# Patient Record
Sex: Female | Born: 1995 | Race: White | Hispanic: No | Marital: Married | Smoking: Never smoker
Health system: Southern US, Community
[De-identification: ages and names within clinical notes are randomized; demographics above are authoritative.]

## PROBLEM LIST (undated history)

## (undated) HISTORY — PX: EXTERNAL EAR SURGERY: SHX627

---
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
## 2014-06-16 ENCOUNTER — Encounter (HOSPITAL_COMMUNITY): Payer: Self-pay | Admitting: Emergency Medicine

## 2014-06-16 ENCOUNTER — Inpatient Hospital Stay (HOSPITAL_COMMUNITY)
Admission: EM | Admit: 2014-06-16 | Discharge: 2014-06-17 | DRG: 552 | Disposition: A | Discharge status: Home / Self Care | Payer: Self-pay | Attending: Surgery | Admitting: Surgery

## 2014-06-16 ENCOUNTER — Emergency Department (HOSPITAL_COMMUNITY): Payer: Self-pay

## 2014-06-16 DIAGNOSIS — S129XXA Fracture of neck, unspecified, initial encounter: Secondary | ICD-10-CM

## 2014-06-16 DIAGNOSIS — S01309A Unspecified open wound of unspecified ear, initial encounter: Secondary | ICD-10-CM | POA: Diagnosis present

## 2014-06-16 DIAGNOSIS — S01312A Laceration without foreign body of left ear, initial encounter: Secondary | ICD-10-CM

## 2014-06-16 DIAGNOSIS — S06320A Contusion and laceration of left cerebrum without loss of consciousness, initial encounter: Secondary | ICD-10-CM

## 2014-06-16 DIAGNOSIS — S12000A Unspecified displaced fracture of first cervical vertebra, initial encounter for closed fracture: Principal | ICD-10-CM | POA: Diagnosis present

## 2014-06-16 DIAGNOSIS — S01302A Unspecified open wound of left ear, initial encounter: Secondary | ICD-10-CM | POA: Diagnosis present

## 2014-06-16 DIAGNOSIS — S0633AA Contusion and laceration of cerebrum, unspecified, with loss of consciousness status unknown, initial encounter: Secondary | ICD-10-CM | POA: Diagnosis present

## 2014-06-16 DIAGNOSIS — S062X0A Diffuse traumatic brain injury without loss of consciousness, initial encounter: Secondary | ICD-10-CM | POA: Diagnosis present

## 2014-06-16 DIAGNOSIS — S06339A Contusion and laceration of cerebrum, unspecified, with loss of consciousness of unspecified duration, initial encounter: Secondary | ICD-10-CM | POA: Diagnosis present

## 2014-06-16 LAB — CBC
HCT: 39.6 % (ref 36.0–46.0)
Hemoglobin: 12.9 g/dL (ref 12.0–15.0)
MCH: 26.7 pg (ref 26.0–34.0)
MCHC: 32.6 g/dL (ref 30.0–36.0)
MCV: 82 fL (ref 78.0–100.0)
PLATELETS: 315 10*3/uL (ref 150–400)
RBC: 4.83 MIL/uL (ref 3.87–5.11)
RDW: 13.3 % (ref 11.5–15.5)
WBC: 18.5 10*3/uL — ABNORMAL HIGH (ref 4.0–10.5)

## 2014-06-16 LAB — COMPREHENSIVE METABOLIC PANEL
ALT: 13 U/L (ref 0–35)
ANION GAP: 13 (ref 5–15)
AST: 17 U/L (ref 0–37)
Albumin: 4.2 g/dL (ref 3.5–5.2)
Alkaline Phosphatase: 56 U/L (ref 39–117)
BILIRUBIN TOTAL: 0.3 mg/dL (ref 0.3–1.2)
BUN: 7 mg/dL (ref 6–23)
CHLORIDE: 104 meq/L (ref 96–112)
CO2: 23 meq/L (ref 19–32)
CREATININE: 0.69 mg/dL (ref 0.50–1.10)
Calcium: 9.1 mg/dL (ref 8.4–10.5)
GFR calc Af Amer: >90 mL/min (ref >90)
GLUCOSE: 108 mg/dL — AB (ref 70–99)
Potassium: 3.9 mEq/L (ref 3.7–5.3)
Sodium: 140 mEq/L (ref 137–147)
Total Protein: 6.9 g/dL (ref 6.0–8.3)

## 2014-06-16 LAB — SAMPLE TO BLOOD BANK

## 2014-06-16 LAB — ETHANOL: Alcohol, Ethyl (B): <11 mg/dL (ref 0–11)

## 2014-06-16 LAB — PROTIME-INR
INR: 1.07 (ref 0.00–1.49)
PROTHROMBIN TIME: 13.9 s (ref 11.6–15.2)

## 2014-06-16 LAB — MRSA PCR SCREENING: MRSA by PCR: NEGATIVE

## 2014-06-16 MED ORDER — FENTANYL CITRATE 0.05 MG/ML IJ SOLN
INTRAMUSCULAR | Status: AC
  Filled 2014-06-16: qty 2

## 2014-06-16 MED ORDER — CIPROFLOXACIN-DEXAMETHASONE 0.3-0.1 % OT SUSP
4.0000 [drp] | Freq: Once | OTIC | Status: AC
  Administered 2014-06-16: 4 [drp] via OTIC
  Filled 2014-06-16: qty 7.5

## 2014-06-16 MED ORDER — ONDANSETRON HCL 4 MG/2ML IJ SOLN
4.0000 mg | Freq: Once | INTRAMUSCULAR | Status: AC
Start: 2014-06-16 — End: 2014-06-16
  Administered 2014-06-16: 4 mg via INTRAVENOUS
  Filled 2014-06-16: qty 2

## 2014-06-16 MED ORDER — OXYCODONE HCL 5 MG PO TABS
5.0000 mg | ORAL_TABLET | ORAL | Status: DC | PRN
  Administered 2014-06-17: 5 mg via ORAL
  Filled 2014-06-16: qty 1

## 2014-06-16 MED ORDER — CIPROFLOXACIN-DEXAMETHASONE 0.3-0.1 % OT SUSP
4.0000 [drp] | Freq: Two times a day (BID) | OTIC | Status: DC
  Filled 2014-06-16: qty 7.5

## 2014-06-16 MED ORDER — MORPHINE SULFATE 2 MG/ML IJ SOLN: 2.0000 mg | INTRAMUSCULAR | Status: DC | PRN

## 2014-06-16 MED ORDER — ONDANSETRON HCL 4 MG PO TABS: 4.0000 mg | ORAL_TABLET | Freq: Four times a day (QID) | ORAL | Status: DC | PRN

## 2014-06-16 MED ORDER — FENTANYL CITRATE 0.05 MG/ML IJ SOLN
50.0000 ug | Freq: Once | INTRAMUSCULAR | Status: AC
  Administered 2014-06-16: 50 ug via INTRAVENOUS
  Filled 2014-06-16: qty 2

## 2014-06-16 MED ORDER — TETANUS-DIPHTH-ACELL PERTUSSIS 5-2.5-18.5 LF-MCG/0.5 IM SUSP
0.5000 mL | Freq: Once | INTRAMUSCULAR | Status: AC
  Administered 2014-06-16: 0.5 mL via INTRAMUSCULAR
  Filled 2014-06-16: qty 0.5

## 2014-06-16 MED ORDER — SODIUM CHLORIDE 0.9 % IV SOLN
INTRAVENOUS | Status: DC
  Administered 2014-06-16 – 2014-06-17 (×2): via INTRAVENOUS

## 2014-06-16 MED ORDER — ONDANSETRON HCL 4 MG/2ML IJ SOLN: 4.0000 mg | Freq: Four times a day (QID) | INTRAMUSCULAR | Status: DC | PRN

## 2014-06-16 NOTE — ED Notes (Signed)
ENT MD remains at bedside suturing.
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
## 2014-06-16 NOTE — ED Notes (Signed)
ENT and Trauma MDs at bedside.
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
## 2014-06-16 NOTE — ED Notes (Signed)
Pt vomiting, orders received for zofran

## 2014-06-17 ENCOUNTER — Inpatient Hospital Stay (HOSPITAL_COMMUNITY): Payer: Medicaid Other

## 2014-06-17 DIAGNOSIS — S12000A Unspecified displaced fracture of first cervical vertebra, initial encounter for closed fracture: Secondary | ICD-10-CM | POA: Diagnosis present

## 2014-06-17 DIAGNOSIS — S01302A Unspecified open wound of left ear, initial encounter: Secondary | ICD-10-CM | POA: Diagnosis present

## 2014-06-17 MED ORDER — OXYCODONE-ACETAMINOPHEN 5-325 MG PO TABS: 1.0000 | ORAL_TABLET | ORAL | Status: DC | PRN

## 2014-06-17 MED ORDER — CIPROFLOXACIN-DEXAMETHASONE 0.3-0.1 % OT SUSP: 4.0000 [drp] | Freq: Two times a day (BID) | OTIC | Status: DC

## 2014-06-17 NOTE — Progress Notes (Addendum)
ED CM received phone call from patient concerning not being able to afford discharge prescription. Reviewed record. CM enrolled patient in [facility] program., Patient reports not having MATCH letter printed MATCH letter, patient requested to have it faxed to [facility] on [address]. MATCH faxed confirmed receipt. Notified [facility] pharmacy. No further CM needs identified.

## 2014-06-17 NOTE — Progress Notes (Addendum)
UR completed.  Pt enrolled in MATCH program for medication assistance.  Provided with letter, list of participating pharmacies and an explanation of the process. Pt/caregiver stated understanding.  Airyn Ellzey, RN BSN [facility] CCM Trauma/Neuro ICU Case Manager [phone]  

## 2014-06-17 NOTE — Discharge Summary (Signed)
Agree with summary. 

## 2014-06-17 NOTE — Progress Notes (Signed)
Patient seen and examined.  Will advance diet and mobilize and see how she feels.
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
## 2014-06-17 NOTE — Discharge Instructions (Signed)
Auricle Injuries You have an injury to your external ear (auricle). The ear has a layer of skin over cartilage. A cut or bruise to the ear can separate the skin from the cartilage underneath. This can cause problems with healing if blood gathers between the skin and the cartilage. Permanent damage to the ear may result if the excess blood is not drained within 1 to 2 days. Stitches, tape, or tissue glue may be used to close a cut. A pressure bandage may be used to keep blood from forming under the injured skin. If there is a lot of blood present (hematoma), a needle aspiration may be needed to remove it. You must have the ear checked within 1 to 2 days or as directed if you have had this type of injury. This is see if the blood has accumulated again. Call your caregiver for a follow-up exam as recommended.  SEEK IMMEDIATE MEDICAL CARE IF:  You develop severe pain.  You develop a fever or pus like drainage.  You have increased hearing loss or other problems. MAKE SURE YOU:   Understand these instructions.  Will watch your condition.  Will get help right away if you are not doing well or get worse. Document Released: 09/26/2005 Document Revised: 12/19/2011 Document Reviewed: 03/15/2007 [facility] Patient Information 2015 [place], [place]. This information is not intended to replace advice given to you by your health care provider. Make sure you discuss any questions you have with your health care provider.  OK to shower Ear drops to LEFT ear twice daily Keep head elevated 3-4 nights Clean wounds/sutures with Qtip and water twice daily, then apply a thin coat of Bacitracin ointment. Recheck 7-8 days for suture removal ([phone]) for an appointment Call for bleeding, signs of infection Large ear dressing can be removed on Wednesday.  Keep collar on at all times.  No driving while taking oxycodone and until cleared by Dr. Venetia Maxon
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
## 2014-10-02 ENCOUNTER — Emergency Department (HOSPITAL_COMMUNITY)
Admission: EM | Admit: 2014-10-02 | Discharge: 2014-10-02 | Discharge status: Against Medical Advice | Payer: Medicaid Other | Attending: Emergency Medicine | Admitting: Emergency Medicine

## 2014-10-02 ENCOUNTER — Encounter (HOSPITAL_COMMUNITY): Payer: Self-pay | Admitting: Emergency Medicine

## 2014-10-02 DIAGNOSIS — R110 Nausea: Secondary | ICD-10-CM | POA: Diagnosis not present

## 2014-10-02 DIAGNOSIS — R109 Unspecified abdominal pain: Secondary | ICD-10-CM | POA: Insufficient documentation

## 2014-10-02 LAB — URINALYSIS, ROUTINE W REFLEX MICROSCOPIC
BILIRUBIN URINE: NEGATIVE
Glucose, UA: NEGATIVE mg/dL
Hgb urine dipstick: NEGATIVE
Ketones, ur: 15 mg/dL — AB
Leukocytes, UA: NEGATIVE
NITRITE: NEGATIVE
PROTEIN: NEGATIVE mg/dL
UROBILINOGEN UA: 0.2 mg/dL (ref 0.0–1.0)
pH: 6 (ref 5.0–8.0)

## 2014-10-02 LAB — POC URINE PREG, ED: Preg Test, Ur: POSITIVE — AB

## 2014-10-02 NOTE — ED Notes (Signed)
Pt has decided to leave AMA, d/t long wait for physician to exam her.

## 2014-10-02 NOTE — ED Notes (Signed)
Pt is having nausea and bloating feeling.

## 2014-10-10 NOTE — L&D Delivery Note (Signed)
Delivery Note At 1:28 AM a viable female was delivered via Vaginal, Spontaneous Delivery (Presentation: ; Occiput Anterior).  APGAR: , ; weight 5 lb 10.8 oz (2575 g).   Placenta status: Intact, Spontaneous. Meconium Stained  Cord: 3 vessels with the following complications: Knot.  Cord pH: Infant initially floppy; Corc clamped and cut and placed immediately in the warmer. Stimulation given and infant cried.Nursery Personal present  Anesthesia: Epidural  Episiotomy:  none Lacerations:  none Suture Repair:  Est. Blood Loss (mL):    Mom to postpartum.  Baby to Couplet care / Skin to Skin.  Casey Maxwell 06/06/2015, 2:03 AM

## 2014-10-20 ENCOUNTER — Encounter: Payer: Medicaid Other | Admitting: Women's Health

## 2014-10-24 ENCOUNTER — Other Ambulatory Visit: Payer: Self-pay | Admitting: Obstetrics & Gynecology

## 2014-10-24 DIAGNOSIS — O3680X0 Pregnancy with inconclusive fetal viability, not applicable or unspecified: Secondary | ICD-10-CM

## 2014-10-27 ENCOUNTER — Ambulatory Visit (INDEPENDENT_AMBULATORY_CARE_PROVIDER_SITE_OTHER): Payer: Medicaid Other

## 2014-10-27 DIAGNOSIS — O3680X0 Pregnancy with inconclusive fetal viability, not applicable or unspecified: Secondary | ICD-10-CM
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
## 2014-11-06 ENCOUNTER — Ambulatory Visit (INDEPENDENT_AMBULATORY_CARE_PROVIDER_SITE_OTHER): Payer: Medicaid Other | Admitting: Advanced Practice Midwife

## 2014-11-06 ENCOUNTER — Encounter: Payer: Self-pay | Admitting: Advanced Practice Midwife

## 2014-11-06 VITALS — BP 120/80 | Wt 99.0 lb

## 2014-11-06 DIAGNOSIS — Z114 Encounter for screening for human immunodeficiency virus [HIV]: Secondary | ICD-10-CM

## 2014-11-06 DIAGNOSIS — Z331 Pregnant state, incidental: Secondary | ICD-10-CM

## 2014-11-06 DIAGNOSIS — Z3400 Encounter for supervision of normal first pregnancy, unspecified trimester: Secondary | ICD-10-CM | POA: Insufficient documentation

## 2014-11-06 DIAGNOSIS — Z0283 Encounter for blood-alcohol and blood-drug test: Secondary | ICD-10-CM

## 2014-11-06 DIAGNOSIS — Z113 Encounter for screening for infections with a predominantly sexual mode of transmission: Secondary | ICD-10-CM

## 2014-11-06 DIAGNOSIS — Z0184 Encounter for antibody response examination: Secondary | ICD-10-CM

## 2014-11-06 DIAGNOSIS — Z1389 Encounter for screening for other disorder: Secondary | ICD-10-CM

## 2014-11-06 DIAGNOSIS — Z3401 Encounter for supervision of normal first pregnancy, first trimester: Secondary | ICD-10-CM

## 2014-11-06 DIAGNOSIS — Z118 Encounter for screening for other infectious and parasitic diseases: Secondary | ICD-10-CM

## 2014-11-06 DIAGNOSIS — Z3682 Encounter for antenatal screening for nuchal translucency: Secondary | ICD-10-CM

## 2014-11-06 DIAGNOSIS — Z1159 Encounter for screening for other viral diseases: Secondary | ICD-10-CM

## 2014-11-06 LAB — POCT URINALYSIS DIPSTICK
GLUCOSE UA: NEGATIVE
Ketones, UA: NEGATIVE
LEUKOCYTES UA: NEGATIVE
NITRITE UA: NEGATIVE
PROTEIN UA: NEGATIVE
RBC UA: NEGATIVE

## 2014-11-06 LAB — OB RESULTS CONSOLE RUBELLA ANTIBODY, IGM: Rubella: IMMUNE

## 2014-11-06 MED ORDER — DOXYLAMINE-PYRIDOXINE 10-10 MG PO TBEC
DELAYED_RELEASE_TABLET | ORAL | Status: DC
Start: 2014-11-06 — End: 2014-11-26

## 2014-11-06 MED ORDER — PRENATAL VITAMINS 0.8 MG PO TABS: 1.0000 | ORAL_TABLET | Freq: Every day | ORAL | Status: DC
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
## 2014-11-06 NOTE — Patient Instructions (Signed)
Nausea & Vomiting  Have saltine crackers or pretzels by your bed and eat a few bites before you raise your head out of bed in the morning  Eat small frequent meals throughout the day instead of large meals  Drink plenty of fluids throughout the day to stay hydrated, just don't drink a lot of fluids with your meals. This can make your stomach fill up faster making you feel sick  Do not brush your teeth right after you eat  Products with real ginger are good for nausea, like ginger ale and ginger hard candy Make sure it says made with real ginger!  Sucking on sour candy like lemon heads is also good for nausea  If your prenatal vitamins make you nauseated, take them at night so you will sleep through the nausea  If you feel like you need medicine for the nausea & vomiting please let us know  If you are unable to keep any fluids or food down please let us know

## 2014-11-06 NOTE — Progress Notes (Signed)
Pt states that she has noticed some spotting after she has intercourse but no other bleeding noted. Pt given CCNC form and lab consents to read over and sign.

## 2014-11-07 LAB — URINALYSIS, ROUTINE W REFLEX MICROSCOPIC
Bilirubin, UA: NEGATIVE
Glucose, UA: NEGATIVE
Ketones, UA: NEGATIVE
LEUKOCYTES UA: NEGATIVE
Nitrite, UA: NEGATIVE
PH UA: 8 — AB (ref 5.0–7.5)
Specific Gravity, UA: 1.02 (ref 1.005–1.030)
Urobilinogen, Ur: 1 mg/dL (ref 0.2–1.0)

## 2014-11-07 LAB — MICROSCOPIC EXAMINATION
Bacteria, UA: NONE SEEN
Casts: NONE SEEN /lpf

## 2014-11-07 LAB — URINE CULTURE

## 2014-11-09 LAB — GC/CHLAMYDIA PROBE AMP
Chlamydia trachomatis, NAA: POSITIVE — AB
Neisseria gonorrhoeae by PCR: NEGATIVE

## 2014-11-11 ENCOUNTER — Other Ambulatory Visit: Payer: Self-pay | Admitting: Advanced Practice Midwife

## 2014-11-11 ENCOUNTER — Encounter: Payer: Self-pay | Admitting: Advanced Practice Midwife

## 2014-11-11 ENCOUNTER — Telehealth: Payer: Self-pay | Admitting: Advanced Practice Midwife

## 2014-11-11 DIAGNOSIS — Z3401 Encounter for supervision of normal first pregnancy, first trimester: Secondary | ICD-10-CM

## 2014-11-11 DIAGNOSIS — A749 Chlamydial infection, unspecified: Secondary | ICD-10-CM | POA: Insufficient documentation

## 2014-11-11 LAB — CBC
HCT: 40.3 % (ref 34.0–46.6)
Hemoglobin: 13.3 g/dL (ref 11.1–15.9)
MCH: 25.7 pg — AB (ref 26.6–33.0)
MCHC: 33 g/dL (ref 31.5–35.7)
MCV: 78 fL — ABNORMAL LOW (ref 79–97)
PLATELETS: 253 10*3/uL (ref 150–379)
RBC: 5.17 x10E6/uL (ref 3.77–5.28)
RDW: 18.2 % — ABNORMAL HIGH (ref 12.3–15.4)
WBC: 5.1 10*3/uL (ref 3.4–10.8)

## 2014-11-11 LAB — PMP SCREEN PROFILE (10S), URINE
AMPHETAMINE SCRN UR: NEGATIVE ng/mL
Barbiturate Screen, Ur: NEGATIVE ng/mL
Benzodiazepine Screen, Urine: NEGATIVE ng/mL
Cannabinoids Ur Ql Scn: NEGATIVE ng/mL
Cocaine(Metab.)Screen, Urine: NEGATIVE ng/mL
Creatinine(Crt), U: 164.9 mg/dL (ref 20.0–300.0)
METHADONE SCREEN, URINE: NEGATIVE ng/mL
Opiate Scrn, Ur: NEGATIVE ng/mL
Oxycodone+Oxymorphone Ur Ql Scn: NEGATIVE ng/mL
PCP SCRN UR: NEGATIVE ng/mL
PH UR, DRUG SCRN: 8.6 (ref 4.5–8.9)
PROPOXYPHENE SCREEN: NEGATIVE ng/mL

## 2014-11-11 LAB — ABO/RH: Rh Factor: POSITIVE

## 2014-11-11 LAB — HEPATITIS B SURFACE ANTIGEN: Hepatitis B Surface Ag: NEGATIVE

## 2014-11-11 LAB — RUBELLA SCREEN: Rubella Antibodies, IGG: 5.53 index (ref >0.99)

## 2014-11-11 LAB — ANTIBODY SCREEN: ANTIBODY SCREEN: NEGATIVE

## 2014-11-11 LAB — HIV ANTIBODY (ROUTINE TESTING W REFLEX): HIV Screen 4th Generation wRfx: NONREACTIVE

## 2014-11-11 LAB — RPR: RPR: NONREACTIVE

## 2014-11-11 LAB — VARICELLA ZOSTER ANTIBODY, IGG: Varicella zoster IgG: 218 index (ref >165)

## 2014-11-11 MED ORDER — AZITHROMYCIN 500 MG PO TABS: 1000.0000 mg | ORAL_TABLET | Freq: Every day | ORAL | Status: DC

## 2014-11-11 NOTE — Progress Notes (Signed)
+   CHL. Azithromycin 1gm for pt and partner.  POC 3 weeks.
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
## 2014-11-17 ENCOUNTER — Telehealth: Payer: Self-pay | Admitting: Advanced Practice Midwife

## 2014-11-17 ENCOUNTER — Telehealth: Payer: Self-pay | Admitting: Women's Health

## 2014-11-17 NOTE — Telephone Encounter (Signed)
Pt requesting that I speak with her boyfriends Mother, Rubin Payordith, and explain results of + CHL and treatment. Pt verbalized her DOB and address for identification purposes and [facility]. Informed of + CHL results and [facility] pt and partner. Rubin Payordith states that MCD was still pending and Rx was $700.00 dollars would be 1 week before RX could be filled. Informed  NO SEX and get RX as soon as possible and pt to keep her appt for 11/26/2014. Verbalized understanding.

## 2014-11-17 NOTE — Telephone Encounter (Signed)
Pt states returning [name], CNM call. Pt informed of positive CHL results, [facility] and [facility], no sex until POC. Pt has an appt for 11/26/2014. Pt verbalized understanding.

## 2014-11-26 ENCOUNTER — Ambulatory Visit (INDEPENDENT_AMBULATORY_CARE_PROVIDER_SITE_OTHER): Payer: Medicaid Other

## 2014-11-26 ENCOUNTER — Encounter: Payer: Self-pay | Admitting: Women's Health

## 2014-11-26 ENCOUNTER — Ambulatory Visit (INDEPENDENT_AMBULATORY_CARE_PROVIDER_SITE_OTHER): Payer: Medicaid Other | Admitting: Women's Health

## 2014-11-26 VITALS — BP 108/58 | Wt 99.0 lb

## 2014-11-26 DIAGNOSIS — Z3401 Encounter for supervision of normal first pregnancy, first trimester: Secondary | ICD-10-CM

## 2014-11-26 DIAGNOSIS — Z3682 Encounter for antenatal screening for nuchal translucency: Secondary | ICD-10-CM

## 2014-11-26 DIAGNOSIS — Z331 Pregnant state, incidental: Secondary | ICD-10-CM

## 2014-11-26 DIAGNOSIS — A749 Chlamydial infection, unspecified: Secondary | ICD-10-CM

## 2014-11-26 DIAGNOSIS — Z1389 Encounter for screening for other disorder: Secondary | ICD-10-CM

## 2014-11-26 DIAGNOSIS — Z36 Encounter for antenatal screening of mother: Secondary | ICD-10-CM

## 2014-11-26 LAB — POCT URINALYSIS DIPSTICK
Glucose, UA: NEGATIVE
Ketones, UA: NEGATIVE
LEUKOCYTES UA: NEGATIVE
Nitrite, UA: NEGATIVE
PROTEIN UA: NEGATIVE
RBC UA: NEGATIVE

## 2014-11-26 MED ORDER — DOXYLAMINE-PYRIDOXINE 10-10 MG PO TBEC: DELAYED_RELEASE_TABLET | ORAL | Status: DC

## 2014-11-26 NOTE — Progress Notes (Signed)
U/S(12+5wks)-single IUP with +FCA noted, FHR-148 bpm, CRL c/w dates, cx appears closed, bilateral adnexa appears WNL, NB present, NT-1.4326mm, anterior Gr 0 placenta

## 2014-11-26 NOTE — Patient Instructions (Signed)
First Trimester of Pregnancy The first trimester of pregnancy is from week 1 until the end of week 12 (months 1 through 3). A week after a sperm fertilizes an egg, the egg will implant on the wall of the uterus. This embryo will begin to develop into a baby. Genes from you and your partner are forming the baby. The female genes determine whether the baby is a boy or a girl. At 6-8 weeks, the eyes and face are formed, and the heartbeat can be seen on ultrasound. At the end of 12 weeks, all the baby's organs are formed.  Now that you are pregnant, you will want to do everything you can to have a healthy baby. Two of the most important things are to get good prenatal care and to follow your health care provider's instructions. Prenatal care is all the medical care you receive before the baby's birth. This care will help prevent, find, and treat any problems during the pregnancy and childbirth. BODY CHANGES Your body goes through many changes during pregnancy. The changes vary from woman to woman.   You may gain or lose a couple of pounds at first.  You may feel sick to your stomach (nauseous) and throw up (vomit). If the vomiting is uncontrollable, call your health care provider.  You may tire easily.  You may develop headaches that can be relieved by medicines approved by your health care provider.  You may urinate more often. Painful urination may mean you have a bladder infection.  You may develop heartburn as a result of your pregnancy.  You may develop constipation because certain hormones are causing the muscles that push waste through your intestines to slow down.  You may develop hemorrhoids or swollen, bulging veins (varicose veins).  Your breasts may begin to grow larger and become tender. Your nipples may stick out more, and the tissue that surrounds them (areola) may become darker.  Your gums may bleed and may be sensitive to brushing and flossing.  Dark spots or blotches (chloasma,  mask of pregnancy) may develop on your face. This will likely fade after the baby is born.  Your menstrual periods will stop.  You may have a loss of appetite.  You may develop cravings for certain kinds of food.  You may have changes in your emotions from day to day, such as being excited to be pregnant or being concerned that something may go wrong with the pregnancy and baby.  You may have more vivid and strange dreams.  You may have changes in your hair. These can include thickening of your hair, rapid growth, and changes in texture. Some women also have hair loss during or after pregnancy, or hair that feels dry or thin. Your hair will most likely return to normal after your baby is born. WHAT TO EXPECT AT YOUR PRENATAL VISITS During a routine prenatal visit:  You will be weighed to make sure you and the baby are growing normally.  Your blood pressure will be taken.  Your abdomen will be measured to track your baby's growth.  The fetal heartbeat will be listened to starting around week 10 or 12 of your pregnancy.  Test results from any previous visits will be discussed. Your health care provider may ask you:  How you are feeling.  If you are feeling the baby move.  If you have had any abnormal symptoms, such as leaking fluid, bleeding, severe headaches, or abdominal cramping.  If you have any questions. Other tests   that may be performed during your first trimester include:  Blood tests to find your blood type and to check for the presence of any previous infections. They will also be used to check for low iron levels (anemia) and Rh antibodies. Later in the pregnancy, blood tests for diabetes will be done along with other tests if problems develop.  Urine tests to check for infections, diabetes, or protein in the urine.  An ultrasound to confirm the proper growth and development of the baby.  An amniocentesis to check for possible genetic problems.  Fetal screens for  spina bifida and Down syndrome.  You may need other tests to make sure you and the baby are doing well. HOME CARE INSTRUCTIONS  Medicines  Follow your health care provider's instructions regarding medicine use. Specific medicines may be either safe or unsafe to take during pregnancy.  Take your prenatal vitamins as directed.  If you develop constipation, try taking a stool softener if your health care provider approves. Diet  Eat regular, well-balanced meals. Choose a variety of foods, such as meat or vegetable-based protein, fish, milk and low-fat dairy products, vegetables, fruits, and whole grain breads and cereals. Your health care provider will help you determine the amount of weight gain that is right for you.  Avoid raw meat and uncooked cheese. These carry germs that can cause birth defects in the baby.  Eating four or five small meals rather than three large meals a day may help relieve nausea and vomiting. If you start to feel nauseous, eating a few soda crackers can be helpful. Drinking liquids between meals instead of during meals also seems to help nausea and vomiting.  If you develop constipation, eat more high-fiber foods, such as fresh vegetables or fruit and whole grains. Drink enough fluids to keep your urine clear or pale yellow. Activity and Exercise  Exercise only as directed by your health care provider. Exercising will help you:  Control your weight.  Stay in shape.  Be prepared for labor and delivery.  Experiencing pain or cramping in the lower abdomen or low back is a good sign that you should stop exercising. Check with your health care provider before continuing normal exercises.  Try to avoid standing for long periods of time. Move your legs often if you must stand in one place for a long time.  Avoid heavy lifting.  Wear low-heeled shoes, and practice good posture.  You may continue to have sex unless your health care provider directs you  otherwise. Relief of Pain or Discomfort  Wear a good support bra for breast tenderness.   Take warm sitz baths to soothe any pain or discomfort caused by hemorrhoids. Use hemorrhoid cream if your health care provider approves.   Rest with your legs elevated if you have leg cramps or low back pain.  If you develop varicose veins in your legs, wear support hose. Elevate your feet for 15 minutes, 3-4 times a day. Limit salt in your diet. Prenatal Care  Schedule your prenatal visits by the twelfth week of pregnancy. They are usually scheduled monthly at first, then more often in the last 2 months before delivery.  Write down your questions. Take them to your prenatal visits.  Keep all your prenatal visits as directed by your health care provider. Safety  Wear your seat belt at all times when driving.  Make a list of emergency phone numbers, including numbers for family, friends, the hospital, and police and fire departments. General Tips    Ask your health care provider for a referral to a local prenatal education class. Begin classes no later than at the beginning of month 6 of your pregnancy.  Ask for help if you have counseling or nutritional needs during pregnancy. Your health care provider can offer advice or refer you to specialists for help with various needs.  Do not use hot tubs, steam rooms, or saunas.  Do not douche or use tampons or scented sanitary pads.  Do not cross your legs for long periods of time.  Avoid cat litter boxes and soil used by cats. These carry germs that can cause birth defects in the baby and possibly loss of the fetus by miscarriage or stillbirth.  Avoid all smoking, herbs, alcohol, and medicines not prescribed by your health care provider. Chemicals in these affect the formation and growth of the baby.  Schedule a dentist appointment. At home, brush your teeth with a soft toothbrush and be gentle when you floss. SEEK MEDICAL CARE IF:   You have  dizziness.  You have mild pelvic cramps, pelvic pressure, or nagging pain in the abdominal area.  You have persistent nausea, vomiting, or diarrhea.  You have a bad smelling vaginal discharge.  You have pain with urination.  You notice increased swelling in your face, hands, legs, or ankles. SEEK IMMEDIATE MEDICAL CARE IF:   You have a fever.  You are leaking fluid from your vagina.  You have spotting or bleeding from your vagina.  You have severe abdominal cramping or pain.  You have rapid weight gain or loss.  You vomit blood or material that looks like coffee grounds.  You are exposed to German measles and have never had them.  You are exposed to fifth disease or chickenpox.  You develop a severe headache.  You have shortness of breath.  You have any kind of trauma, such as from a fall or a car accident. Document Released: 09/20/2001 Document Revised: 02/10/2014 Document Reviewed: 08/06/2013 [employer] Patient Information 2015 [employer]. This information is not intended to replace advice given to you by your health care provider. Make sure you discuss any questions you have with your health care provider.  

## 2014-11-26 NOTE — Progress Notes (Addendum)
Low-risk OB appointment G1P0 [id number] Estimated Date of Delivery: 06/05/15 BP 108/58 mmHg  Wt 99 lb (44.906 kg)  LMP 08/29/2014  BP, weight, and urine reviewed.  Refer to obstetrical flow sheet for FH & FHR.  No fm yet. Denies cramping, lof, vb, or uti s/s. No complaints. Is getting azithromycin for +CT filled today, hasn't been able to until now d/t insurance.  Reviewed today's normal nt u/s. Discussed warning s/s to report. Plan:  Continue routine obstetrical care  F/U in 4wks for OB appointment, CT POC, and 2nd IT

## 2014-11-27 ENCOUNTER — Other Ambulatory Visit: Payer: Self-pay

## 2014-11-28 LAB — MATERNAL SCREEN, INTEGRATED #1
Crown Rump Length: 57.3 mm
Gest. Age on Collection Date: 12.3 weeks
Maternal Age at EDD: 20.8 years
NUCHAL TRANSLUCENCY (NT): 1.3 mm
Number of Fetuses: 1
PAPP-A VALUE: 1561.8 ng/mL
Weight: 99 [lb_av]

## 2014-12-09 ENCOUNTER — Telehealth: Payer: Self-pay | Admitting: *Deleted

## 2014-12-09 NOTE — Telephone Encounter (Signed)
Requesting what pt was treated with for the + CHL. Azithromycin 1,000 mg po daily e-scribed on 11/11/2014.

## 2014-12-24 ENCOUNTER — Ambulatory Visit (INDEPENDENT_AMBULATORY_CARE_PROVIDER_SITE_OTHER): Payer: Medicaid Other | Admitting: Women's Health

## 2014-12-24 ENCOUNTER — Encounter: Payer: Self-pay | Admitting: Women's Health

## 2014-12-24 VITALS — BP 118/62 | HR 80 | Wt 102.0 lb

## 2014-12-24 DIAGNOSIS — O09892 Supervision of other high risk pregnancies, second trimester: Secondary | ICD-10-CM

## 2014-12-24 DIAGNOSIS — Z3402 Encounter for supervision of normal first pregnancy, second trimester: Secondary | ICD-10-CM

## 2014-12-24 DIAGNOSIS — Z1389 Encounter for screening for other disorder: Secondary | ICD-10-CM

## 2014-12-24 DIAGNOSIS — O09292 Supervision of pregnancy with other poor reproductive or obstetric history, second trimester: Secondary | ICD-10-CM

## 2014-12-24 DIAGNOSIS — Z3682 Encounter for antenatal screening for nuchal translucency: Secondary | ICD-10-CM

## 2014-12-24 DIAGNOSIS — Z363 Encounter for antenatal screening for malformations: Secondary | ICD-10-CM

## 2014-12-24 DIAGNOSIS — Z331 Pregnant state, incidental: Secondary | ICD-10-CM

## 2014-12-24 LAB — POCT URINALYSIS DIPSTICK
Blood, UA: NEGATIVE
GLUCOSE UA: NEGATIVE
Ketones, UA: NEGATIVE
LEUKOCYTES UA: NEGATIVE
NITRITE UA: NEGATIVE
Protein, UA: NEGATIVE

## 2014-12-24 MED ORDER — CONCEPT DHA 53.5-38-1 MG PO CAPS: 1.0000 | ORAL_CAPSULE | Freq: Every day | ORAL | Status: DC

## 2014-12-24 NOTE — Patient Instructions (Addendum)
Flinstone gummies if the prenatal vitamins are still making you sick  Second Trimester of Pregnancy The second trimester is from week 13 through week 28, months 4 through 6. The second trimester is often a time when you feel your best. Your body has also adjusted to being pregnant, and you begin to feel better physically. Usually, morning sickness has lessened or quit completely, you may have more energy, and you may have an increase in appetite. The second trimester is also a time when the fetus is growing rapidly. At the end of the sixth month, the fetus is about 9 inches long and weighs about 1 pounds. You will likely begin to feel the baby move (quickening) between 18 and 20 weeks of the pregnancy. BODY CHANGES Your body goes through many changes during pregnancy. The changes vary from woman to woman.   Your weight will continue to increase. You will notice your lower abdomen bulging out.  You may begin to get stretch marks on your hips, abdomen, and breasts.  You may develop headaches that can be relieved by medicines approved by your health care provider.  You may urinate more often because the fetus is pressing on your bladder.  You may develop or continue to have heartburn as a result of your pregnancy.  You may develop constipation because certain hormones are causing the muscles that push waste through your intestines to slow down.  You may develop hemorrhoids or swollen, bulging veins (varicose veins).  You may have back pain because of the weight gain and pregnancy hormones relaxing your joints between the bones in your pelvis and as a result of a shift in weight and the muscles that support your balance.  Your breasts will continue to grow and be tender.  Your gums may bleed and may be sensitive to brushing and flossing.  Dark spots or blotches (chloasma, mask of pregnancy) may develop on your face. This will likely fade after the baby is born.  A dark line from your belly  button to the pubic area (linea nigra) may appear. This will likely fade after the baby is born.  You may have changes in your hair. These can include thickening of your hair, rapid growth, and changes in texture. Some women also have hair loss during or after pregnancy, or hair that feels dry or thin. Your hair will most likely return to normal after your baby is born. WHAT TO EXPECT AT YOUR PRENATAL VISITS During a routine prenatal visit:  You will be weighed to make sure you and the fetus are growing normally.  Your blood pressure will be taken.  Your abdomen will be measured to track your baby's growth.  The fetal heartbeat will be listened to.  Any test results from the previous visit will be discussed. Your health care provider may ask you:  How you are feeling.  If you are feeling the baby move.  If you have had any abnormal symptoms, such as leaking fluid, bleeding, severe headaches, or abdominal cramping.  If you have any questions. Other tests that may be performed during your second trimester include:  Blood tests that check for:  Low iron levels (anemia).  Gestational diabetes (between 24 and 28 weeks).  Rh antibodies.  Urine tests to check for infections, diabetes, or protein in the urine.  An ultrasound to confirm the proper growth and development of the baby.  An amniocentesis to check for possible genetic problems.  Fetal screens for spina bifida and Down syndrome.  HOME CARE INSTRUCTIONS   Avoid all smoking, herbs, alcohol, and unprescribed drugs. These chemicals affect the formation and growth of the baby.  Follow your health care provider's instructions regarding medicine use. There are medicines that are either safe or unsafe to take during pregnancy.  Exercise only as directed by your health care provider. Experiencing uterine cramps is a good sign to stop exercising.  Continue to eat regular, healthy meals.  Wear a good support bra for breast  tenderness.  Do not use hot tubs, steam rooms, or saunas.  Wear your seat belt at all times when driving.  Avoid raw meat, uncooked cheese, cat litter boxes, and soil used by cats. These carry germs that can cause birth defects in the baby.  Take your prenatal vitamins.  Try taking a stool softener (if your health care provider approves) if you develop constipation. Eat more high-fiber foods, such as fresh vegetables or fruit and whole grains. Drink plenty of fluids to keep your urine clear or pale yellow.  Take warm sitz baths to soothe any pain or discomfort caused by hemorrhoids. Use hemorrhoid cream if your health care provider approves.  If you develop varicose veins, wear support hose. Elevate your feet for 15 minutes, 3-4 times a day. Limit salt in your diet.  Avoid heavy lifting, wear low heel shoes, and practice good posture.  Rest with your legs elevated if you have leg cramps or low back pain.  Visit your dentist if you have not gone yet during your pregnancy. Use a soft toothbrush to brush your teeth and be gentle when you floss.  A sexual relationship may be continued unless your health care provider directs you otherwise.  Continue to go to all your prenatal visits as directed by your health care provider. SEEK MEDICAL CARE IF:   You have dizziness.  You have mild pelvic cramps, pelvic pressure, or nagging pain in the abdominal area.  You have persistent nausea, vomiting, or diarrhea.  You have a bad smelling vaginal discharge.  You have pain with urination. SEEK IMMEDIATE MEDICAL CARE IF:   You have a fever.  You are leaking fluid from your vagina.  You have spotting or bleeding from your vagina.  You have severe abdominal cramping or pain.  You have rapid weight gain or loss.  You have shortness of breath with chest pain.  You notice sudden or extreme swelling of your face, hands, ankles, feet, or legs.  You have not felt your baby move in over an  hour.  You have severe headaches that do not go away with medicine.  You have vision changes. Document Released: 09/20/2001 Document Revised: 10/01/2013 Document Reviewed: 11/27/2012 [facility] Patient Information 2015 [place], [place]. This information is not intended to replace advice given to you by your health care provider. Make sure you discuss any questions you have with your health care provider.

## 2014-12-24 NOTE — Progress Notes (Signed)
Low-risk OB appointment G1P0 [id number] Estimated Date of Delivery: 06/05/15 LMP 08/29/2014  BP, weight, and urine reviewed.  Refer to obstetrical flow sheet for FH & FHR.  No fm yet. Denies cramping, lof, vb, or uti s/s. PNV making her sick even when taking at night, will try concept dha, if still makes sick to do flinstone gummies.  CT POC today Reviewed warning s/s to report. Plan:  Continue routine obstetrical care  F/U in 4wks for OB appointment and anatomy u/s

## 2014-12-25 LAB — GC/CHLAMYDIA PROBE AMP
Chlamydia trachomatis, NAA: NEGATIVE
NEISSERIA GONORRHOEAE BY PCR: NEGATIVE

## 2014-12-26 LAB — MATERNAL SCREEN, INTEGRATED #2
AFP MARKER: 93.8 ng/mL
AFP MOM: 2.34
Crown Rump Length: 57.3 mm
DIA MoM: 0.6
DIA VALUE: 140 pg/mL
Estriol, Unconjugated: 1.64 ng/mL
Gest. Age on Collection Date: 12.3 weeks
Gestational Age: 16.3 weeks
HCG MOM: 1.63
HCG VALUE: 64.4 [IU]/mL
MATERNAL AGE AT EDD: 20.8 a
NUMBER OF FETUSES: 1
Nuchal Translucency (NT): 1.3 mm
Nuchal Translucency MoM: 1.08
PAPP-A MoM: 1.07
PAPP-A Value: 1561.8 ng/mL
TEST RESULTS: NEGATIVE
WEIGHT: 102 [lb_av]
Weight: 99 [lb_av]
uE3 MoM: 1.7

## 2015-01-05 ENCOUNTER — Encounter (HOSPITAL_COMMUNITY): Payer: Self-pay | Admitting: Emergency Medicine

## 2015-01-05 ENCOUNTER — Emergency Department (HOSPITAL_COMMUNITY)
Admission: EM | Admit: 2015-01-05 | Discharge: 2015-01-05 | Disposition: A | Discharge status: Home / Self Care | Payer: Medicaid Other | Attending: Emergency Medicine | Admitting: Emergency Medicine

## 2015-01-05 DIAGNOSIS — Z3A18 18 weeks gestation of pregnancy: Secondary | ICD-10-CM | POA: Diagnosis not present

## 2015-01-05 DIAGNOSIS — Z79899 Other long term (current) drug therapy: Secondary | ICD-10-CM | POA: Diagnosis not present

## 2015-01-05 DIAGNOSIS — O200 Threatened abortion: Secondary | ICD-10-CM | POA: Insufficient documentation

## 2015-01-05 DIAGNOSIS — O209 Hemorrhage in early pregnancy, unspecified: Secondary | ICD-10-CM | POA: Diagnosis present

## 2015-01-05 NOTE — Discharge Instructions (Signed)
Threatened Miscarriage Your blood type is A positive. [facility] to discuss your visit here tonight. He may want to see you in the office A threatened miscarriage occurs when you have vaginal bleeding during your first 20 weeks of pregnancy but the pregnancy has not ended. If you have vaginal bleeding during this time, your health care provider will do tests to make sure you are still pregnant. If the tests show you are still pregnant and the developing baby (fetus) inside your womb (uterus) is still growing, your condition is considered a threatened miscarriage. A threatened miscarriage does not mean your pregnancy will end, but it does increase the risk of losing your pregnancy (complete miscarriage). CAUSES  The cause of a threatened miscarriage is usually not known. If you go on to have a complete miscarriage, the most common cause is an abnormal number of chromosomes in the developing baby. Chromosomes are the structures inside cells that hold all your genetic material. Some causes of vaginal bleeding that do not result in miscarriage include:  Having sex.  Having an infection.  Normal hormone changes of pregnancy.  Bleeding that occurs when an egg implants in your uterus. RISK FACTORS Risk factors for bleeding in early pregnancy include:  Obesity.  Smoking.  Drinking excessive amounts of alcohol or caffeine.  Recreational drug use. SIGNS AND SYMPTOMS  Light vaginal bleeding.  Mild abdominal pain or cramps. DIAGNOSIS  If you have bleeding with or without abdominal pain before 20 weeks of pregnancy, your health care provider will do tests to check whether you are still pregnant. One important test involves using sound waves and a computer (ultrasound) to create images of the inside of your uterus. Other tests include an internal exam of your vagina and uterus (pelvic exam) and measurement of your baby's heart rate.  You may be diagnosed with a  threatened miscarriage if:  Ultrasound testing shows you are still pregnant.  Your baby's heart rate is strong.  A pelvic exam shows that the opening between your uterus and your vagina (cervix) is closed.  Your heart rate and blood pressure are stable.  Blood tests confirm you are still pregnant. TREATMENT  No treatments have been shown to prevent a threatened miscarriage from going on to a complete miscarriage. However, the right home care is important.  HOME CARE INSTRUCTIONS   Make sure you keep all your appointments for prenatal care. This is very important.  Get plenty of rest.  Do not have sex or use tampons if you have vaginal bleeding.  Do not douche.  Do not smoke or use recreational drugs.  Do not drink alcohol.  Avoid caffeine. SEEK MEDICAL CARE IF:  You have light vaginal bleeding or spotting while pregnant.  You have abdominal pain or cramping.  You have a fever. SEEK IMMEDIATE MEDICAL CARE IF:  You have heavy vaginal bleeding.  You have blood clots coming from your vagina.  You have severe low back pain or abdominal cramps.  You have fever, chills, and severe abdominal pain. MAKE SURE YOU:  Understand these instructions.  Will watch your condition.  Will get help right away if you are not doing well or get worse. Document Released: 09/26/2005 Document Revised: 10/01/2013 Document Reviewed: 07/23/2013 [facility] Patient Information 2015 [employer] [place]. This information is not intended to replace advice given to you by your health care provider. Make sure you discuss any questions you have with your health care provider.

## 2015-01-05 NOTE — ED Notes (Signed)
Patient reports is [redacted] weeks pregnant. States she had sex with partner and now is having vaginal bleeding. Reports has had vaginal bleeding all day. States bleeding was heavy at first, but now she is spotting. Denies any pain or cramping.

## 2015-01-05 NOTE — ED Provider Notes (Signed)
CSN: [id number]     Arrival date & time 01/05/15  2135 History   First MD Initiated Contact with Patient 01/05/15 2156    This chart was scribed for Doug Sou, MD by Marica Otter, ED Scribe. This patient was seen in room APA06/APA06 and the patient's care was started at 9:59 PM.  Chief Complaint  Patient presents with  . Vaginal Bleeding   The history is provided by the patient. No language interpreter was used.   PCP: PROVIDER NOT IN SYSTEM  OB/GYN: [facility] HPI Comments: Casey Maxwell is a 19 y.o. female, who is G1P0 and [redacted] weeks pregnant, who presents to the Emergency Department complaining of improving vaginal bleeding following sexual intercourse onset 11 PM yesterday. Pt notes that while the bleeding was heavy at onset it has slowed and is now spotting only. Denies pain denies lightheadedness no other associated symptoms Pt denies EtOH use, tobacco use, recreational drug use. Pt's daily meds includes: diclegis and multivitamins.   History reviewed. No pertinent past medical history. Past Surgical History  Procedure Laterality Date  . External ear surgery     History reviewed. No pertinent family history. History  Substance Use Topics  . Smoking status: Never Smoker   . Smokeless tobacco: Never Used  . Alcohol Use: No    Review of Systems  Constitutional: Negative.   HENT: Negative.   Respiratory: Negative.   Cardiovascular: Negative.   Gastrointestinal: Negative.   Genitourinary: Positive for vaginal bleeding.       Pregnant  Musculoskeletal: Negative.   Skin: Negative.   Neurological: Negative.   Psychiatric/Behavioral: Negative.   All other systems reviewed and are negative.     Allergies  Review of patient's allergies indicates no known allergies.  Home Medications   Prior to Admission medications   Medication Sig Start Date End Date Taking? Authorizing Provider  azithromycin (ZITHROMAX) 500 MG tablet Take 2 tablets (1,000 mg total) by mouth  daily. Patient not taking: Reported on 12/24/2014 11/11/14   Jacklyn Shell, CNM  Doxylamine-Pyridoxine (DICLEGIS) 10-10 MG TBEC 2 tabs q hs, if sx persist add 1 tab q am on day 3, if sx persist add 1 tab q afternoon on day 4 11/26/14   Cheral Marker, CNM  Prenat-FeFum-FePo-FA-Omega 3 (CONCEPT DHA) 53.5-38-1 MG CAPS Take 1 capsule by mouth daily. 12/24/14   Cheral Marker, CNM   Triage Vitals: BP 139/83 mmHg  Pulse 97  Temp(Src) 98.3 F (36.8 C) (Oral)  Resp 18  Ht 5' (1.524 m)  Wt 103 lb (46.72 kg)  BMI 20.12 kg/m2  SpO2 100%  LMP 08/29/2014 Physical Exam  Constitutional: She appears well-developed and well-nourished.  HENT:  Head: Normocephalic and atraumatic.  Eyes: Conjunctivae are normal. Pupils are equal, round, and reactive to light.  Neck: Neck supple. No tracheal deviation present. No thyromegaly present.  Cardiovascular: Normal rate and regular rhythm.   No murmur heard. Pulmonary/Chest: Effort normal and breath sounds normal.  Abdominal: Soft. Bowel sounds are normal. She exhibits no distension. There is no tenderness.  Gravid fetal heart tones 150  Genitourinary:  No external lesion cervical os closed, tiny amount of dried blood in vault. No active bleeding. No cervical motion tenderness uterine fundus 5 cm superior to umbilicus. No adnexal masses or tenderness  Musculoskeletal: Normal range of motion. She exhibits no edema or tenderness.  Neurological: She is alert. Coordination normal.  Skin: Skin is warm and dry. No rash noted.  Psychiatric: She has a normal  mood and affect.  Nursing note and vitals reviewed.   ED Course  Procedures (including critical care time) DIAGNOSTIC STUDIES: Oxygen Saturation is 100% on RA, nl by my interpretation.    COORDINATION OF CARE: 10:01 PM-Discussed treatment plan with pt at bedside and pt agreed to plan.   Labs Review Labs Reviewed - No data to display  Imaging Review No results found.   EKG  Interpretation None      MDM  From reviewing old records patient has IUP and blood type is A+. Plan pelvic rest follow-up [facility] Diagnosis threatened abortion Final diagnoses:  None      I personally performed the services described in this documentation, which was scribed in my presence. The recorded information has been reviewed and considered.    Doug SouSam Jassiel Flye, MD 01/05/15 2232

## 2015-01-07 ENCOUNTER — Ambulatory Visit (INDEPENDENT_AMBULATORY_CARE_PROVIDER_SITE_OTHER): Payer: Medicaid Other | Admitting: Women's Health

## 2015-01-07 VITALS — BP 106/50 | HR 103 | Wt 105.5 lb

## 2015-01-07 DIAGNOSIS — Z3402 Encounter for supervision of normal first pregnancy, second trimester: Secondary | ICD-10-CM

## 2015-01-07 DIAGNOSIS — Z1389 Encounter for screening for other disorder: Secondary | ICD-10-CM

## 2015-01-07 DIAGNOSIS — Z331 Pregnant state, incidental: Secondary | ICD-10-CM

## 2015-01-07 DIAGNOSIS — N930 Postcoital and contact bleeding: Secondary | ICD-10-CM

## 2015-01-07 LAB — POCT URINALYSIS DIPSTICK
Blood, UA: NEGATIVE
Glucose, UA: NEGATIVE
Ketones, UA: NEGATIVE
Leukocytes, UA: NEGATIVE
NITRITE UA: NEGATIVE
Protein, UA: NEGATIVE

## 2015-01-07 NOTE — Patient Instructions (Signed)
NO SEX FOR AT LEAST 7 DAYS FROM THE LAST TIME YOU HAVE SEEN ANY BLEEDING  Vaginal Bleeding During Pregnancy, Second Trimester A small amount of bleeding (spotting) from the vagina is relatively common in pregnancy. It usually stops on its own. Various things can cause bleeding or spotting in pregnancy. Some bleeding may be related to the pregnancy, and some may not. Sometimes the bleeding is normal and is not a problem. However, bleeding can also be a sign of something serious. Be sure to tell your health care provider about any vaginal bleeding right away. Some possible causes of vaginal bleeding during the second trimester include:  Infection, inflammation, or growths on the cervix.   The placenta may be partially or completely covering the opening of the cervix inside the uterus (placenta previa).  The placenta may have separated from the uterus (abruption of the placenta).   You may be having early (preterm) labor.   The cervix may not be strong enough to keep a baby inside the uterus (cervical insufficiency).   Tiny cysts may have developed in the uterus instead of pregnancy tissue (molar pregnancy). HOME CARE INSTRUCTIONS  Watch your condition for any changes. The following actions may help to lessen any discomfort you are feeling:  Follow your health care provider's instructions for limiting your activity. If your health care provider orders bed rest, you may need to stay in bed and only get up to use the bathroom. However, your health care provider may allow you to continue light activity.  If needed, make plans for someone to help with your regular activities and responsibilities while you are on bed rest.  Keep track of the number of pads you use each day, how often you change pads, and how soaked (saturated) they are. Write this down.  Do not use tampons. Do not douche.  Do not have sexual intercourse or orgasms until approved by your health care provider.  If you pass  any tissue from your vagina, save the tissue so you can show it to your health care provider.  Only take over-the-counter or prescription medicines as directed by your health care provider.  Do not take aspirin because it can make you bleed.  Do not exercise or perform any strenuous activities or heavy lifting without your health care provider's permission.  Keep all follow-up appointments as directed by your health care provider. SEEK MEDICAL CARE IF:  You have any vaginal bleeding during any part of your pregnancy.  You have cramps or labor pains.  You have a fever, not controlled by medicine. SEEK IMMEDIATE MEDICAL CARE IF:   You have severe cramps in your back or belly (abdomen).  You have contractions.  You have chills.  You pass large clots or tissue from your vagina.  Your bleeding increases.  You feel light-headed or weak, or you have fainting episodes.  You are leaking fluid or have a gush of fluid from your vagina. MAKE SURE YOU:  Understand these instructions.  Will watch your condition.  Will get help right away if you are not doing well or get worse. Document Released: 07/06/2005 Document Revised: 10/01/2013 Document Reviewed: 06/03/2013 [facility] Patient Information 2015 [place], [place]. This information is not intended to replace advice given to you by your health care provider. Make sure you discuss any questions you have with your health care provider.

## 2015-01-07 NOTE — Progress Notes (Signed)
Low-risk OB appointment G1P0 1530w5d Estimated Date of Delivery: 06/05/15 BP 106/50 mmHg  Pulse 103  Wt 105 lb 8 oz (47.854 kg)  LMP 08/29/2014  BP, weight, and urine reviewed.  Refer to obstetrical flow sheet for FH & FHR.  Reports good fm.  Denies regular uc's, lof, or uti s/s.  Here as f/u from ED visit 2d ago for postcoital vb. No vb since yesterday am and that was just a small spot. Rh+. Recommended pelvic rest x 7d from last vb.  Reviewed warning s/s to report. Plan:  Continue routine obstetrical care  F/U as scheduled in couple of weeks for OB appointment and anatomy u/s or earlier if more bleeding

## 2015-01-21 ENCOUNTER — Ambulatory Visit (INDEPENDENT_AMBULATORY_CARE_PROVIDER_SITE_OTHER): Payer: Medicaid Other

## 2015-01-21 ENCOUNTER — Ambulatory Visit (INDEPENDENT_AMBULATORY_CARE_PROVIDER_SITE_OTHER): Payer: Medicaid Other | Admitting: Advanced Practice Midwife

## 2015-01-21 ENCOUNTER — Encounter: Payer: Self-pay | Admitting: Advanced Practice Midwife

## 2015-01-21 VITALS — BP 120/70 | HR 80 | Wt 109.0 lb

## 2015-01-21 DIAGNOSIS — Z3402 Encounter for supervision of normal first pregnancy, second trimester: Secondary | ICD-10-CM

## 2015-01-21 DIAGNOSIS — Z36 Encounter for antenatal screening of mother: Secondary | ICD-10-CM | POA: Diagnosis not present

## 2015-01-21 DIAGNOSIS — A749 Chlamydial infection, unspecified: Secondary | ICD-10-CM

## 2015-01-21 DIAGNOSIS — Z1389 Encounter for screening for other disorder: Secondary | ICD-10-CM

## 2015-01-21 DIAGNOSIS — Z331 Pregnant state, incidental: Secondary | ICD-10-CM

## 2015-01-21 DIAGNOSIS — Z363 Encounter for antenatal screening for malformations: Secondary | ICD-10-CM

## 2015-01-21 LAB — POCT URINALYSIS DIPSTICK
GLUCOSE UA: NEGATIVE
Ketones, UA: NEGATIVE
LEUKOCYTES UA: NEGATIVE
NITRITE UA: NEGATIVE
Protein, UA: NEGATIVE
RBC UA: NEGATIVE

## 2015-01-21 NOTE — Progress Notes (Signed)
US 4820w5d c/w dates,EFW 335g,breech,sdp of fluid 3.9cm,cx appears closed 3.8cm,ant pl grade 0,anatomy scan complete w/no obvious abnormalities seen,normal ov's bilat

## 2015-01-21 NOTE — Progress Notes (Signed)
G1P0 1090w5d Estimated Date of Delivery: 06/05/15  Blood pressure 120/70, pulse 80, weight 109 lb (49.442 kg), last menstrual period 08/29/2014.   BP weight and urine results all reviewed and noted.  Please refer to the obstetrical flow sheet for the fundal height and fetal heart rate documentation: Had anatomy scan today: [place] 10490w5d c/w dates,EFW 335g,breech,sdp of fluid 3.9cm,cx appears closed 3.8cm,ant pl grade 0,anatomy scan complete w/no obvious abnormalities seen,normal ov's bilat  Patient reports good fetal movement, denies any bleeding and no rupture of membranes symptoms or regular contractions. Patient is without complaints. All questions were answered.  Plan:  Continued routine obstetrical care,   Follow up in 4 weeks for OB appointment,

## 2015-02-18 ENCOUNTER — Ambulatory Visit (INDEPENDENT_AMBULATORY_CARE_PROVIDER_SITE_OTHER): Payer: Medicaid Other | Admitting: Advanced Practice Midwife

## 2015-02-18 ENCOUNTER — Encounter: Payer: Self-pay | Admitting: Advanced Practice Midwife

## 2015-02-18 VITALS — BP 114/56 | HR 76 | Wt 113.0 lb

## 2015-02-18 DIAGNOSIS — O26842 Uterine size-date discrepancy, second trimester: Secondary | ICD-10-CM

## 2015-02-18 DIAGNOSIS — Z331 Pregnant state, incidental: Secondary | ICD-10-CM

## 2015-02-18 DIAGNOSIS — Z1389 Encounter for screening for other disorder: Secondary | ICD-10-CM

## 2015-02-18 DIAGNOSIS — Z3402 Encounter for supervision of normal first pregnancy, second trimester: Secondary | ICD-10-CM

## 2015-02-18 LAB — POCT URINALYSIS DIPSTICK
Blood, UA: NEGATIVE
Glucose, UA: NEGATIVE
Ketones, UA: NEGATIVE
LEUKOCYTES UA: NEGATIVE
NITRITE UA: NEGATIVE
PROTEIN UA: NEGATIVE

## 2015-02-18 NOTE — Patient Instructions (Addendum)
1. Before your test, do not eat or drink anything for 8-10 hours prior to your  appointment (a small amount of water is allowed and you may take any medicines you normally take). Be sure to drink lots of water the day before. 2. When you arrive, your blood will be drawn for a 'fasting' blood sugar level.  Then you will be given a sweetened carbonated beverage to drink. You should  complete drinking this beverage within five minutes. After finishing the  beverage, you will have your blood drawn exactly 1 and 2 hours later. Having  your blood drawn on time is an important part of this test. A total of three blood  samples will be done. 3. The test takes approximately 2  hours. During the test, do not have anything to  eat or drink. Do not smoke, chew gum (not even sugarless gum) or use breath mints.  4. During the test you should remain close by and seated as much as possible and  avoid walking around. You may want to bring a book or something else to  occupy your time.  5. After your test, you may eat and drink as normal. You may want to bring a snack  to eat after the test is finished. Your provider will advise you as to the results of  this test and any follow-up if necessary  You will also be retested for syphilis, HIV and blood levels (anemia):  You were already tested in the first trimester, but [facility] recommends retesting.  Additionally, you will be tested for Type 2 Herpes. MOST people do not know that they have genital herpes, as only around 15% of people have outbreaks.  However, it is still transmittable to other people, including the baby (but only during the birth).  If you test positive for Type 2 Herpes, we place you on a medicine called acyclovir the last 6 weeks of your pregnancy to prevent transmission of the virus to the baby during the birth.    If your sugar test is positive for gestational diabetes, you will be given an phone call and further instructions discussed.   We typically do not call patients with positive herpes results, but will discuss it at your next appointment.  If you wish to know all of your test results before your next appointment, feel free to call the office, or look up your test results on Mychart.  (The range that the lab uses for normal values of the sugar test are not necessarily the range that is used for pregnant women; if your results are within the range, they are definitely normal.  However, if a value is deemed "high" by the lab, it may not be too high for a pregnant woman.  We will need to discuss the normal range if your value(s) fall in the "high" category).     Tdap Vaccine  It is recommended that you get the Tdap vaccine during the third trimester of EACH pregnancy to help protect your baby from getting pertussis (whooping cough)  27-36 weeks is the BEST time to do this so that you can pass the protection on to your baby. During pregnancy is better than after pregnancy, but if you are unable to get it during pregnancy it will be offered at the hospital.  You can get this vaccine at the health department or your family doctor, as well as some pharmacies.  Everyone who will be around your baby should also be up-to-date on   their vaccines. Adults (who are not pregnant) only need 1 dose of Tdap during adulthood.     Back Pain in Pregnancy Back pain during pregnancy is common. It happens in about half of all pregnancies. It is important for you and your baby that you remain active during your pregnancy.If you feel that back pain is not allowing you to remain active or sleep well, it is time to see your caregiver. Back pain may be caused by several factors related to changes during your pregnancy.Fortunately, unless you had trouble with your back before your pregnancy, the pain is likely to get better after you deliver. Low back pain usually occurs between the fifth and seventh months of pregnancy. It can, however, happen in the  first couple months. Factors that increase the risk of back problems include:   Previous back problems.  Injury to your back.  Having twins or multiple births.  A chronic cough.  Stress.  Job-related repetitive motions.  Muscle or spinal disease in the back.  Family history of back problems, ruptured (herniated) discs, or osteoporosis.  Depression, anxiety, and panic attacks. CAUSES   When you are pregnant, your body produces a hormone called relaxin. This hormonemakes the ligaments connecting the low back and pubic bones more flexible. This flexibility allows the baby to be delivered more easily. When your ligaments are loose, your muscles need to work harder to support your back. Soreness in your back can come from tired muscles. Soreness can also come from back tissues that are irritated since they are receiving less support.  As the baby grows, it puts pressure on the nerves and blood vessels in your pelvis. This can cause back pain.  As the baby grows and gets heavier during pregnancy, the uterus pushes the stomach muscles forward and changes your center of gravity. This makes your back muscles work harder to maintain good posture. SYMPTOMS  Lumbar pain during pregnancy Lumbar pain during pregnancy usually occurs at or above the waist in the center of the back. There may be pain and numbness that radiates into your leg or foot. This is similar to low back pain experienced by non-pregnant women. It usually increases with sitting for long periods of time, standing, or repetitive lifting. Tenderness may also be present in the muscles along your upper back. Posterior pelvic pain during pregnancy Pain in the back of the pelvis is more common than lumbar pain in pregnancy. It is a deep pain felt in your side at the waistline, or across the tailbone (sacrum), or in both places. You may have pain on one or both sides. This pain can also go into the buttocks and backs of the upper thighs.  Pubic and groin pain may also be present. The pain does not quickly resolve with rest, and morning stiffness may also be present. Pelvic pain during pregnancy can be brought on by most activities. A high level of fitness before and during pregnancy may or may not prevent this problem. Labor pain is usually 1 to 2 minutes apart, lasts for about 1 minute, and involves a bearing down feeling or pressure in your pelvis. However, if you are at term with the pregnancy, constant low back pain can be the beginning of early labor, and you should be aware of this. DIAGNOSIS  X-rays of the back should not be done during the first 12 to 14 weeks of the pregnancy and only when absolutely necessary during the rest of the pregnancy. MRIs do not give off  radiation and are safe during pregnancy. MRIs also should only be done when absolutely necessary. HOME CARE INSTRUCTIONS  Exercise as directed by your caregiver. Exercise is the most effective way to prevent or manage back pain. If you have a back problem, it is especially important to avoid sports that require sudden body movements. Swimming and walking are great activities.  Do not stand in one place for long periods of time.  Do not wear high heels.  Sit in chairs with good posture. Use a pillow on your lower back if necessary. Make sure your head rests over your shoulders and is not hanging forward.  Try sleeping on your side, preferably the left side, with a pillow or two between your legs. If you are sore after a night's rest, your bedmay betoo soft.Try placing a board between your mattress and box spring.  Listen to your body when lifting.If you are experiencing pain, ask for help or try bending yourknees more so you can use your leg muscles rather than your back muscles. Squat down when picking up something from the floor. Do not bend over.  Eat a healthy diet. Try to gain weight within your caregiver's recommendations.  Use heat or cold packs 3 to  4 times a day for 15 minutes to help with the pain.  Only take over-the-counter or prescription medicines for pain, discomfort, or fever as directed by your caregiver. Sudden (acute) back pain  Use bed rest for only the most extreme, acute episodes of back pain. Prolonged bed rest over 48 hours will aggravate your condition.  Ice is very effective for acute conditions.  Put ice in a plastic bag.  Place a towel between your skin and the bag.  Leave the ice on for 10 to 20 minutes every 2 hours, or as needed.  Using heat packs for 30 minutes prior to activities is also helpful. Continued back pain See your caregiver if you have continued problems. Your caregiver can help or refer you for appropriate physical therapy. With conditioning, most back problems can be avoided. Sometimes, a more serious issue may be the cause of back pain. You should be seen right away if new problems seem to be developing. Your caregiver may recommend:  A maternity girdle.  An elastic sling.  A back brace.  A massage therapist or acupuncture. SEEK MEDICAL CARE IF:   You are not able to do most of your daily activities, even when taking the pain medicine you were given.  You need a referral to a physical therapist or chiropractor.  You want to try acupuncture. SEEK IMMEDIATE MEDICAL CARE IF:  You develop numbness, tingling, weakness, or problems with the use of your arms or legs.  You develop severe back pain that is no longer relieved with medicines.  You have a sudden change in bowel or bladder control.  You have increasing pain in other areas of the body.  You develop shortness of breath, dizziness, or fainting.  You develop nausea, vomiting, or sweating.  You have back pain which is similar to labor pains.  You have back pain along with your water breaking or vaginal bleeding.  You have back pain or numbness that travels down your leg.  Your back pain developed after you fell.  You  develop pain on one side of your back. You may have a kidney stone.  You see blood in your urine. You may have a bladder infection or kidney stone.  You have back pain with blisters.  You may have shingles. Back pain is fairly common during pregnancy but should not be accepted as just part of the process. Back pain should always be treated as soon as possible. This will make your pregnancy as pleasant as possible. Document Released: 01/04/2006 Document Revised: 12/19/2011 Document Reviewed: 02/15/2011 [facility] Patient Information 2015 [place], [place]. This information is not intended to replace advice given to you by your health care provider. Make sure you discuss any questions you have with your health care provider.

## 2015-02-18 NOTE — Progress Notes (Signed)
G1P0 6376w5d Estimated Date of Delivery: 06/05/15  Blood pressure 114/56, pulse 76, weight 113 lb (51.256 kg), last menstrual period 08/29/2014.   BP weight and urine results all reviewed and noted.  Please refer to the obstetrical flow sheet for the fundal height and fetal heart rate documentation: Size<dates  Patient reports good fetal movement, denies any bleeding and no rupture of membranes symptoms or regular contractions. Patient is without complaints other than normal pregnancy complaints All questions were answered.  Plan:  Continued routine obstetrical care, will check EFW next visit, although likely genetics causing low FH  Follow up in 3 weeks for OB appointment, PN2

## 2015-03-12 ENCOUNTER — Other Ambulatory Visit: Payer: Medicaid Other

## 2015-03-12 ENCOUNTER — Ambulatory Visit (INDEPENDENT_AMBULATORY_CARE_PROVIDER_SITE_OTHER): Payer: Medicaid Other | Admitting: Advanced Practice Midwife

## 2015-03-12 ENCOUNTER — Ambulatory Visit (INDEPENDENT_AMBULATORY_CARE_PROVIDER_SITE_OTHER): Payer: Medicaid Other

## 2015-03-12 ENCOUNTER — Encounter: Payer: Self-pay | Admitting: Advanced Practice Midwife

## 2015-03-12 VITALS — BP 102/60 | HR 84 | Wt 117.5 lb

## 2015-03-12 DIAGNOSIS — O26842 Uterine size-date discrepancy, second trimester: Secondary | ICD-10-CM | POA: Diagnosis not present

## 2015-03-12 DIAGNOSIS — A749 Chlamydial infection, unspecified: Secondary | ICD-10-CM

## 2015-03-12 DIAGNOSIS — Z331 Pregnant state, incidental: Secondary | ICD-10-CM

## 2015-03-12 DIAGNOSIS — Z131 Encounter for screening for diabetes mellitus: Secondary | ICD-10-CM

## 2015-03-12 DIAGNOSIS — Z1389 Encounter for screening for other disorder: Secondary | ICD-10-CM

## 2015-03-12 DIAGNOSIS — Z3402 Encounter for supervision of normal first pregnancy, second trimester: Secondary | ICD-10-CM

## 2015-03-12 DIAGNOSIS — Z369 Encounter for antenatal screening, unspecified: Secondary | ICD-10-CM

## 2015-03-12 LAB — POCT URINALYSIS DIPSTICK
Blood, UA: NEGATIVE
GLUCOSE UA: NEGATIVE
KETONES UA: NEGATIVE
Leukocytes, UA: NEGATIVE
Nitrite, UA: NEGATIVE
PROTEIN UA: NEGATIVE

## 2015-03-12 NOTE — Progress Notes (Signed)
G1P0 6788w6d Estimated Date of Delivery: 06/05/15  Blood pressure 102/60, pulse 84, weight 117 lb 8 oz (53.298 kg), last menstrual period 08/29/2014.   BP weight and urine results all reviewed and noted.  Please refer to the obstetrical flow sheet for the fundal height and fetal heart rate documentation: FH significantly decreased, but EFW today 34%.    Patient reports good fetal movement, denies any bleeding and no rupture of membranes symptoms or regular contractions. Patient is without complaints. All questions were answered.  Plan:  Continued routine obstetrical care, PN2 today  Follow up in 4 weeks for OB appointment, EFW (probably will need to rely on EFW d/t FH being consistently decreased)

## 2015-03-12 NOTE — Progress Notes (Signed)
US 27+6WKS,efw 995g 34.7%,breech,afi 14cm,normal ov's bilat,fhr 132bpm,ant pl gr 2

## 2015-03-13 LAB — RPR: RPR: NONREACTIVE

## 2015-03-13 LAB — GLUCOSE TOLERANCE, 2 HOURS W/ 1HR
GLUCOSE, 1 HOUR: 132 mg/dL (ref 65–179)
Glucose, 2 hour: 98 mg/dL (ref 65–152)
Glucose, Fasting: 74 mg/dL (ref 65–91)

## 2015-03-13 LAB — CBC
HEMATOCRIT: 35 % (ref 34.0–46.6)
Hemoglobin: 11.6 g/dL (ref 11.1–15.9)
MCH: 28 pg (ref 26.6–33.0)
MCHC: 33.1 g/dL (ref 31.5–35.7)
MCV: 85 fL (ref 79–97)
PLATELETS: 282 10*3/uL (ref 150–379)
RBC: 4.14 x10E6/uL (ref 3.77–5.28)
RDW: 13.6 % (ref 12.3–15.4)
WBC: 8.1 10*3/uL (ref 3.4–10.8)

## 2015-03-13 LAB — HIV ANTIBODY (ROUTINE TESTING W REFLEX): HIV SCREEN 4TH GENERATION: NONREACTIVE

## 2015-03-13 LAB — HSV 2 ANTIBODY, IGG

## 2015-03-13 LAB — ANTIBODY SCREEN: ANTIBODY SCREEN: NEGATIVE

## 2015-03-16 LAB — OB RESULTS CONSOLE HIV ANTIBODY (ROUTINE TESTING): HIV: NONREACTIVE

## 2015-04-09 ENCOUNTER — Ambulatory Visit (INDEPENDENT_AMBULATORY_CARE_PROVIDER_SITE_OTHER): Payer: Medicaid Other

## 2015-04-09 ENCOUNTER — Ambulatory Visit (INDEPENDENT_AMBULATORY_CARE_PROVIDER_SITE_OTHER): Payer: Medicaid Other | Admitting: Advanced Practice Midwife

## 2015-04-09 ENCOUNTER — Encounter: Payer: Self-pay | Admitting: Advanced Practice Midwife

## 2015-04-09 VITALS — BP 102/60 | HR 88 | Wt 118.5 lb

## 2015-04-09 DIAGNOSIS — Z1389 Encounter for screening for other disorder: Secondary | ICD-10-CM

## 2015-04-09 DIAGNOSIS — Z3403 Encounter for supervision of normal first pregnancy, third trimester: Secondary | ICD-10-CM

## 2015-04-09 DIAGNOSIS — A749 Chlamydial infection, unspecified: Secondary | ICD-10-CM

## 2015-04-09 DIAGNOSIS — O26842 Uterine size-date discrepancy, second trimester: Secondary | ICD-10-CM

## 2015-04-09 DIAGNOSIS — Z331 Pregnant state, incidental: Secondary | ICD-10-CM

## 2015-04-09 LAB — POCT URINALYSIS DIPSTICK
Glucose, UA: NEGATIVE
KETONES UA: NEGATIVE
Leukocytes, UA: NEGATIVE
Nitrite, UA: NEGATIVE
Protein, UA: NEGATIVE
RBC UA: NEGATIVE

## 2015-04-09 NOTE — Progress Notes (Signed)
[place] 31+6wks measurement c/w dates, efw 1769g 40.5%,normal ov's bilat,cephalic,fht 143bpm,afi 11.2cm,ant pl gr 0

## 2015-04-09 NOTE — Progress Notes (Signed)
G1P0 6470w6d Estimated Date of Delivery: 06/05/15  Last menstrual period 08/29/2014.   BP weight and urine results all reviewed and noted.  Please refer to the obstetrical flow sheet for the fundal height and fetal heart rate documentation: US today for size<dates: [place] 31+6wks measurement c/w dates, efw 1769g 40.5%,normal ov's bilat,cephalic,fht 143bpm,afi 11.2cm,ant pl gr 0   Patient reports good fetal movement, denies any bleeding and no rupture of membranes symptoms or regular contractions. Patient is without complaints. All questions were answered.  Plan:  Continued routine obstetrical care,   Follow up in 2 weeks for OB appointment,

## 2015-04-23 ENCOUNTER — Encounter: Payer: Self-pay | Admitting: Advanced Practice Midwife

## 2015-04-23 ENCOUNTER — Ambulatory Visit (INDEPENDENT_AMBULATORY_CARE_PROVIDER_SITE_OTHER): Payer: Medicaid Other | Admitting: Advanced Practice Midwife

## 2015-04-23 VITALS — BP 102/60 | HR 88 | Wt 123.0 lb

## 2015-04-23 DIAGNOSIS — Z1389 Encounter for screening for other disorder: Secondary | ICD-10-CM

## 2015-04-23 DIAGNOSIS — Z331 Pregnant state, incidental: Secondary | ICD-10-CM

## 2015-04-23 DIAGNOSIS — Z3403 Encounter for supervision of normal first pregnancy, third trimester: Secondary | ICD-10-CM

## 2015-04-23 LAB — POCT URINALYSIS DIPSTICK
Glucose, UA: NEGATIVE
Ketones, UA: NEGATIVE
Nitrite, UA: NEGATIVE
Protein, UA: NEGATIVE
RBC UA: NEGATIVE

## 2015-04-23 NOTE — Progress Notes (Signed)
G1P0 5158w6d Estimated Date of Delivery: 06/05/15  Last menstrual period 08/29/2014.   BP weight and urine results all reviewed and noted.  Please refer to the obstetrical flow sheet for the fundal height and fetal heart rate documentation:  Patient reports good fetal movement, denies any bleeding and no rupture of membranes symptoms or regular contractions. Patient has some leg cramps/restless leg at night.   All questions were answered.  Plan:  Continued routine obstetrical care,   Follow up in 2 weeks for OB appointment,

## 2015-05-07 ENCOUNTER — Ambulatory Visit (INDEPENDENT_AMBULATORY_CARE_PROVIDER_SITE_OTHER): Payer: Medicaid Other | Admitting: Obstetrics & Gynecology

## 2015-05-07 ENCOUNTER — Encounter: Payer: Self-pay | Admitting: Obstetrics & Gynecology

## 2015-05-07 VITALS — BP 110/70 | HR 76 | Wt 124.0 lb

## 2015-05-07 DIAGNOSIS — Z331 Pregnant state, incidental: Secondary | ICD-10-CM

## 2015-05-07 DIAGNOSIS — Z3403 Encounter for supervision of normal first pregnancy, third trimester: Secondary | ICD-10-CM

## 2015-05-07 DIAGNOSIS — Z1389 Encounter for screening for other disorder: Secondary | ICD-10-CM

## 2015-05-07 LAB — POCT URINALYSIS DIPSTICK
Blood, UA: NEGATIVE
GLUCOSE UA: NEGATIVE
KETONES UA: NEGATIVE
Leukocytes, UA: NEGATIVE
NITRITE UA: NEGATIVE

## 2015-05-07 NOTE — Progress Notes (Signed)
G1P0 [redacted]w[redacted]d Estimated Date of Delivery: 06/05/15  Blood pressure 110/70, pulse 76, weight 124 lb (56.246 kg), last menstrual period 08/29/2014.   BP weight and urine results all reviewed and noted.  Please refer to the obstetrical flow sheet for the fundal height and fetal heart rate documentation:  Patient reports good fetal movement, denies any bleeding and no rupture of membranes symptoms or regular contractions. Patient is without complaints. All questions were answered.  Plan:  Continued routine obstetrical care,   Follow up in 1 weeks for OB appointment,

## 2015-05-13 ENCOUNTER — Encounter: Payer: Self-pay | Admitting: Women's Health

## 2015-05-13 ENCOUNTER — Ambulatory Visit (INDEPENDENT_AMBULATORY_CARE_PROVIDER_SITE_OTHER): Payer: Medicaid Other | Admitting: Women's Health

## 2015-05-13 VITALS — BP 104/60 | HR 92 | Wt 126.0 lb

## 2015-05-13 DIAGNOSIS — Z1389 Encounter for screening for other disorder: Secondary | ICD-10-CM

## 2015-05-13 DIAGNOSIS — Z3403 Encounter for supervision of normal first pregnancy, third trimester: Secondary | ICD-10-CM

## 2015-05-13 DIAGNOSIS — Z331 Pregnant state, incidental: Secondary | ICD-10-CM

## 2015-05-13 DIAGNOSIS — Z118 Encounter for screening for other infectious and parasitic diseases: Secondary | ICD-10-CM

## 2015-05-13 DIAGNOSIS — Z3685 Encounter for antenatal screening for Streptococcus B: Secondary | ICD-10-CM

## 2015-05-13 DIAGNOSIS — Z1159 Encounter for screening for other viral diseases: Secondary | ICD-10-CM

## 2015-05-13 LAB — POCT URINALYSIS DIPSTICK
GLUCOSE UA: NEGATIVE
Ketones, UA: NEGATIVE
Leukocytes, UA: NEGATIVE
Nitrite, UA: NEGATIVE
RBC UA: NEGATIVE

## 2015-05-13 LAB — OB RESULTS CONSOLE GC/CHLAMYDIA
Chlamydia: NEGATIVE
GC PROBE AMP, GENITAL: NEGATIVE

## 2015-05-13 NOTE — Patient Instructions (Addendum)
Call the office [phone]) or go to [facility] if:  You begin to have strong, frequent contractions  Your water breaks.  Sometimes it is a big gush of fluid, sometimes it is just a trickle that keeps getting your panties wet or running down your legs  You have vaginal bleeding.  It is normal to have a small amount of spotting if your cervix was checked.   You don't feel your baby moving like normal.  If you don't, get you something to eat and drink and lay down and focus on feeling your baby move.  You should feel at least 10 movements in 2 hours.  If you don't, you should call the office or go to [facility].    [place] Pediatricians/Family Doctors:  [facility] [phone]            [facility] [phone]                 [facility] [phone] (usually not accepting new patients unless you have family there already, you are always welcome to call and ask)            [facility] ([address] [place]) [phone]   [facility] Pediatricians/Family Doctors:   [facility]: [phone]  [facility]: [phone]    Deberah Pelton Contractions Contractions of the uterus can occur throughout pregnancy. Contractions are not always a sign that you are in labor.  WHAT ARE BRAXTON HICKS CONTRACTIONS?  Contractions that occur before labor are called Braxton Hicks contractions, or false labor. Toward the end of pregnancy (32-34 weeks), these contractions can develop more often and may become more forceful. This is not true labor because these contractions do not result in opening (dilatation) and thinning of the cervix. They are sometimes difficult to tell apart from true labor because these contractions can be forceful and people have different pain tolerances. You should not feel embarrassed if you go to the hospital with false labor. Sometimes, the only way to tell if you are in true labor is  for your health care provider to look for changes in the cervix. If there are no prenatal problems or other health problems associated with the pregnancy, it is completely safe to be sent home with false labor and await the onset of true labor. HOW CAN YOU TELL THE DIFFERENCE BETWEEN TRUE AND FALSE LABOR? False Labor 6. The contractions of false labor are usually shorter and not as hard as those of true labor.  7. The contractions are usually irregular.  8. The contractions are often felt in the front of the lower abdomen and in the groin.  9. The contractions may go away when you walk around or change positions while lying down.  10. The contractions get weaker and are shorter lasting as time goes on.  11. The contractions do not usually become progressively stronger, regular, and closer together as with true labor.  True Labor 2. Contractions in true labor last 30-70 seconds, become very regular, usually become more intense, and increase in frequency.  3. The contractions do not go away with walking.  4. The discomfort is usually felt in the top of the uterus and spreads to the lower abdomen and low back.  5. True labor can be determined by your health care provider with an exam. This will show that the cervix is dilating and getting thinner.  WHAT TO REMEMBER 2. Keep up with your usual exercises and follow other  instructions given by your health care provider.  3. Take medicines as directed by your health care provider.  4. Keep your regular prenatal appointments.  5. Eat and drink lightly if you think you are going into labor.  6. If Braxton Hicks contractions are making you uncomfortable:  1. Change your position from lying down or resting to walking, or from walking to resting.  2. Sit and rest in a tub of warm water.  3. Drink 2-3 glasses of water. Dehydration may cause these contractions.  4. Do slow and deep breathing several times an hour.  WHEN SHOULD I SEEK  IMMEDIATE MEDICAL CARE? Seek immediate medical care if: 2. Your contractions become stronger, more regular, and closer together.  3. You have fluid leaking or gushing from your vagina.  4. You have a fever.  5. You pass blood-tinged mucus.  6. You have vaginal bleeding.  7. You have continuous abdominal pain.  8. You have low back pain that you never had before.  9. You feel your baby's head pushing down and causing pelvic pressure.  10. Your baby is not moving as much as it used to.  Document Released: 09/26/2005 Document Revised: 10/01/2013 Document Reviewed: 07/08/2013 [facility] Patient Information 2015 [place], [place]. This information is not intended to replace advice given to you by your health care provider. Make sure you discuss any questions you have with your health care provider.

## 2015-05-13 NOTE — Progress Notes (Signed)
Low-risk OB appointment G1P0 [redacted]w[redacted]d Estimated Date of Delivery: 06/05/15 BP 104/60 mmHg  Pulse 92  Wt 126 lb (57.153 kg)  LMP 08/29/2014  BP, weight, and urine reviewed.  Refer to obstetrical flow sheet for FH & FHR.  Reports good fm.  Denies regular uc's, lof, vb, or uti s/s. No complaints. FH still s<d, u/s's have been good, efw @ 28wks 34%, 40% at 32wks GBS collected SVE per request: ft/70/-2, vtx Reviewed ptl s/s, fkc. Plan:  Continue routine obstetrical care  F/U in 1wk for OB appointment

## 2015-05-14 LAB — GC/CHLAMYDIA PROBE AMP
CHLAMYDIA, DNA PROBE: NEGATIVE
Neisseria gonorrhoeae by PCR: NEGATIVE

## 2015-05-16 LAB — CULTURE, BETA STREP (GROUP B ONLY): Strep Gp B Culture: POSITIVE — AB

## 2015-05-21 ENCOUNTER — Encounter: Payer: Self-pay | Admitting: Obstetrics and Gynecology

## 2015-05-21 ENCOUNTER — Other Ambulatory Visit: Payer: Self-pay | Admitting: Obstetrics and Gynecology

## 2015-05-21 ENCOUNTER — Other Ambulatory Visit (INDEPENDENT_AMBULATORY_CARE_PROVIDER_SITE_OTHER): Payer: Medicaid Other

## 2015-05-21 ENCOUNTER — Ambulatory Visit (INDEPENDENT_AMBULATORY_CARE_PROVIDER_SITE_OTHER): Payer: Medicaid Other | Admitting: Obstetrics and Gynecology

## 2015-05-21 VITALS — BP 120/80 | HR 104 | Wt 126.0 lb

## 2015-05-21 DIAGNOSIS — Z3493 Encounter for supervision of normal pregnancy, unspecified, third trimester: Secondary | ICD-10-CM

## 2015-05-21 DIAGNOSIS — Z3403 Encounter for supervision of normal first pregnancy, third trimester: Secondary | ICD-10-CM

## 2015-05-21 DIAGNOSIS — O365930 Maternal care for other known or suspected poor fetal growth, third trimester, not applicable or unspecified: Secondary | ICD-10-CM | POA: Diagnosis not present

## 2015-05-21 DIAGNOSIS — A749 Chlamydial infection, unspecified: Secondary | ICD-10-CM

## 2015-05-21 DIAGNOSIS — Z331 Pregnant state, incidental: Secondary | ICD-10-CM

## 2015-05-21 DIAGNOSIS — Z1389 Encounter for screening for other disorder: Secondary | ICD-10-CM

## 2015-05-21 LAB — POCT URINALYSIS DIPSTICK
Blood, UA: NEGATIVE
GLUCOSE UA: NEGATIVE
Ketones, UA: NEGATIVE
LEUKOCYTES UA: NEGATIVE
Nitrite, UA: NEGATIVE

## 2015-05-21 NOTE — Progress Notes (Addendum)
Patient ID: Casey Maxwell, female   DOB: 1995/10/22, 19 y.o.   MRN: [id number]  G1P0 [redacted]w[redacted]d Estimated Date of Delivery: 06/05/15  Blood pressure 120/80, pulse 104, weight 126 lb (57.153 kg), last menstrual period 08/29/2014.   refer to the ob flow sheet for FH and FHR, also BP, Wt, Urine results:notable for negative  Patient reports  + good fetal movement, denies any bleeding and no rupture of membranes symptoms or regular contractions. Patient complaints:Pt denies any complaints at this time.  FH-30 cm but appears appropriate for mother's size. The FOB is 5'7". EFW is obtained by u/s 5 lb 8 oz, (2500gm) < 10 %ile appropriate for pt size  BPP also obtained 8/8 , with dopplers S:D ratio 2.8, normal, and RI reassuring,   HR-144 bpm Cervix- 1 cm, 20%, -2  Questions were answered. 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 Plan:  Continued routine obstetrical care.  F/u in 1 week for routine pre-natal care , consider NST then.   This chart was scribed for Tilda Burrow, MD by Gwenyth Ober, Medical Scribe. This patient was seen in room 1 and the patient's care was started at 12:18 PM.   I personally performed the services described in this documentation, which was SCRIBED in my presence. The recorded information has been reviewed and considered accurate. It has been edited as necessary during review. Tilda Burrow, MD

## 2015-05-21 NOTE — Progress Notes (Signed)
[place] 37+6wks,efw 2500g <10% (williams), 4.9% Hadlock,fht 127bpm,normal ov bilat,cephalic,ant pl gr 3,afi 11.6cm,RI .58,.61,s/d 2.46

## 2015-05-21 NOTE — Progress Notes (Deleted)
Patient ID: Casey Maxwell, female   DOB: 09/12/1996, 18 y.o.   MRN: [id number]  G1P0 [redacted]w[redacted]d Estimated Date of Delivery: 06/05/15  Blood pressure 120/80, pulse 104, weight 126 lb (57.153 kg), last menstrual period 08/29/2014.   refer to the ob flow sheet for FH and FHR, also BP, Wt, Urine results:notable for negative  Patient reports  + good fetal movement, denies any bleeding and no rupture of membranes symptoms or regular contractions. Patient complaints:Pt denies any complaints at this time.  FH-30 cm but appears appropriate for mother's size. The FOB is 5'7". EFW is obtained by u/s 5 lb 8 oz, (2500gm) < 10 %ile appropriate for pt size  BPP also obtained 8/8 , with dopplers S:D ratio 2.8, normal, and RI reassuring,   HR-144 bpm Cervix- 1 cm, 20%, -2  Questions were answered. 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 Plan:  Continued routine obstetrical care.  F/u in 1 week for routine pre-natal care , consider NST then.   This chart was scribed for John Ferguson V, MD by Catherine Macek, Medical Scribe. This patient was seen in room 1 and the patient's care was started at 12:18 PM.   I personally performed the services described in this documentation, which was SCRIBED in my presence. The recorded information has been reviewed and considered accurate. It has been edited as necessary during review. FERGUSON,JOHN V, MD     

## 2015-05-21 NOTE — Progress Notes (Signed)
Pt denies any problems or concerns at this time.  

## 2015-05-28 ENCOUNTER — Ambulatory Visit (INDEPENDENT_AMBULATORY_CARE_PROVIDER_SITE_OTHER): Payer: Medicaid Other | Admitting: Obstetrics & Gynecology

## 2015-05-28 VITALS — BP 92/60 | HR 84 | Wt 126.0 lb

## 2015-05-28 DIAGNOSIS — Z331 Pregnant state, incidental: Secondary | ICD-10-CM

## 2015-05-28 DIAGNOSIS — Z1389 Encounter for screening for other disorder: Secondary | ICD-10-CM

## 2015-05-28 DIAGNOSIS — Z3403 Encounter for supervision of normal first pregnancy, third trimester: Secondary | ICD-10-CM

## 2015-05-28 LAB — POCT URINALYSIS DIPSTICK
GLUCOSE UA: NEGATIVE
Ketones, UA: NEGATIVE
Leukocytes, UA: NEGATIVE
NITRITE UA: NEGATIVE
RBC UA: NEGATIVE

## 2015-05-28 NOTE — Progress Notes (Signed)
G1P0 [redacted]w[redacted]d Estimated Date of Delivery: 06/05/15  Blood pressure 92/60, pulse 84, weight 126 lb (57.153 kg), last menstrual period 08/29/2014.   BP weight and urine results all reviewed and noted.  Please refer to the obstetrical flow sheet for the fundal height and fetal heart rate documentation:  Patient reports good fetal movement, denies any bleeding and no rupture of membranes symptoms or regular contractions. Patient is without complaints. All questions were answered.  Plan:  Continued routine obstetrical care,   Follow up in 1 weeks for OB appointment,   SGA with reassuring fetal status, pt is quite small

## 2015-06-01 ENCOUNTER — Telehealth: Payer: Self-pay | Admitting: Radiology

## 2015-06-04 ENCOUNTER — Encounter: Payer: Self-pay | Admitting: Obstetrics & Gynecology

## 2015-06-04 ENCOUNTER — Ambulatory Visit (INDEPENDENT_AMBULATORY_CARE_PROVIDER_SITE_OTHER): Payer: Medicaid Other | Admitting: Obstetrics & Gynecology

## 2015-06-04 VITALS — BP 104/80 | HR 76 | Wt 128.0 lb

## 2015-06-04 DIAGNOSIS — Z1389 Encounter for screening for other disorder: Secondary | ICD-10-CM

## 2015-06-04 DIAGNOSIS — Z3403 Encounter for supervision of normal first pregnancy, third trimester: Secondary | ICD-10-CM | POA: Diagnosis not present

## 2015-06-04 DIAGNOSIS — Z331 Pregnant state, incidental: Secondary | ICD-10-CM

## 2015-06-04 LAB — POCT URINALYSIS DIPSTICK
Blood, UA: NEGATIVE
Glucose, UA: NEGATIVE
Ketones, UA: NEGATIVE
Nitrite, UA: NEGATIVE
Protein, UA: NEGATIVE

## 2015-06-04 NOTE — Progress Notes (Signed)
G1P0 [redacted]w[redacted]d Estimated Date of Delivery: 06/05/15  Blood pressure 104/80, pulse 76, weight 128 lb (58.06 kg), last menstrual period 08/29/2014.   BP weight and urine results all reviewed and noted.  Please refer to the obstetrical flow sheet for the fundal height and fetal heart rate documentation:  Patient reports good fetal movement, denies any bleeding and no rupture of membranes symptoms or regular contractions. Patient is without complaints. All questions were answered.  Plan:  Continued routine obstetrical care,   Follow up in 6 weeks for OB appointment, postpartum

## 2015-06-05 ENCOUNTER — Inpatient Hospital Stay (HOSPITAL_COMMUNITY)
Admission: AD | Admit: 2015-06-05 | Discharge: 2015-06-07 | DRG: 775 | Disposition: A | Discharge status: Home / Self Care | Payer: Medicaid Other | Source: Ambulatory Visit | Attending: Obstetrics and Gynecology | Admitting: Obstetrics and Gynecology

## 2015-06-05 ENCOUNTER — Encounter (HOSPITAL_COMMUNITY): Payer: Self-pay | Admitting: *Deleted

## 2015-06-05 ENCOUNTER — Inpatient Hospital Stay (HOSPITAL_COMMUNITY): Payer: Medicaid Other | Admitting: Anesthesiology

## 2015-06-05 DIAGNOSIS — O99824 Streptococcus B carrier state complicating childbirth: Secondary | ICD-10-CM | POA: Diagnosis present

## 2015-06-05 DIAGNOSIS — IMO0001 Reserved for inherently not codable concepts without codable children: Secondary | ICD-10-CM

## 2015-06-05 DIAGNOSIS — O4292 Full-term premature rupture of membranes, unspecified as to length of time between rupture and onset of labor: Principal | ICD-10-CM | POA: Diagnosis present

## 2015-06-05 DIAGNOSIS — Z3A40 40 weeks gestation of pregnancy: Secondary | ICD-10-CM | POA: Diagnosis present

## 2015-06-05 DIAGNOSIS — Z3403 Encounter for supervision of normal first pregnancy, third trimester: Secondary | ICD-10-CM

## 2015-06-05 DIAGNOSIS — A749 Chlamydial infection, unspecified: Secondary | ICD-10-CM

## 2015-06-05 LAB — CBC
HEMATOCRIT: 29.8 % — AB (ref 36.0–46.0)
HEMOGLOBIN: 9.2 g/dL — AB (ref 12.0–15.0)
MCH: 22.8 pg — ABNORMAL LOW (ref 26.0–34.0)
MCHC: 30.9 g/dL (ref 30.0–36.0)
MCV: 73.8 fL — ABNORMAL LOW (ref 78.0–100.0)
Platelets: 250 10*3/uL (ref 150–400)
RBC: 4.04 MIL/uL (ref 3.87–5.11)
RDW: 15.8 % — ABNORMAL HIGH (ref 11.5–15.5)
WBC: 11 10*3/uL — ABNORMAL HIGH (ref 4.0–10.5)

## 2015-06-05 LAB — OB RESULTS CONSOLE GBS: STREP GROUP B AG: POSITIVE

## 2015-06-05 LAB — TYPE AND SCREEN
ABO/RH(D): A POS
ANTIBODY SCREEN: NEGATIVE

## 2015-06-05 LAB — RPR: RPR: NONREACTIVE

## 2015-06-05 LAB — ABO/RH: ABO/RH(D): A POS

## 2015-06-05 MED ORDER — FENTANYL CITRATE (PF) 100 MCG/2ML IJ SOLN
100.0000 ug | INTRAMUSCULAR | Status: DC | PRN
  Administered 2015-06-05 (×2): 100 ug via INTRAVENOUS
  Filled 2015-06-05 (×2): qty 2

## 2015-06-05 MED ORDER — OXYTOCIN 40 UNITS IN LACTATED RINGERS INFUSION - SIMPLE MED
62.5000 mL/h | INTRAVENOUS | Status: DC
  Administered 2015-06-06: 62.5 mL/h via INTRAVENOUS

## 2015-06-05 MED ORDER — TERBUTALINE SULFATE 1 MG/ML IJ SOLN
0.2500 mg | Freq: Once | INTRAMUSCULAR | Status: DC | PRN
  Filled 2015-06-05: qty 1

## 2015-06-05 MED ORDER — DEXTROSE 5 % IV SOLN
2.5000 10*6.[IU] | INTRAVENOUS | Status: DC
  Administered 2015-06-05 (×3): 2.5 10*6.[IU] via INTRAVENOUS
  Filled 2015-06-05 (×9): qty 2.5

## 2015-06-05 MED ORDER — DIPHENHYDRAMINE HCL 50 MG/ML IJ SOLN: 12.5000 mg | INTRAMUSCULAR | Status: DC | PRN

## 2015-06-05 MED ORDER — PHENYLEPHRINE 40 MCG/ML (10ML) SYRINGE FOR IV PUSH (FOR BLOOD PRESSURE SUPPORT): 80.0000 ug | PREFILLED_SYRINGE | INTRAVENOUS | Status: DC | PRN

## 2015-06-05 MED ORDER — OXYTOCIN 40 UNITS IN LACTATED RINGERS INFUSION - SIMPLE MED
1.0000 m[IU]/min | INTRAVENOUS | Status: DC
  Administered 2015-06-05: 2 m[IU]/min via INTRAVENOUS
  Administered 2015-06-05: 8 m[IU]/min via INTRAVENOUS
  Filled 2015-06-05: qty 1000

## 2015-06-05 MED ORDER — FENTANYL 2.5 MCG/ML BUPIVACAINE 1/10 % EPIDURAL INFUSION (WH - ANES): 11.0000 mL/h | INTRAMUSCULAR | Status: DC | PRN

## 2015-06-05 MED ORDER — LIDOCAINE HCL (PF) 1 % IJ SOLN
30.0000 mL | INTRAMUSCULAR | Status: DC | PRN
  Filled 2015-06-05: qty 30

## 2015-06-05 MED ORDER — OXYCODONE-ACETAMINOPHEN 5-325 MG PO TABS: 2.0000 | ORAL_TABLET | ORAL | Status: DC | PRN

## 2015-06-05 MED ORDER — ONDANSETRON HCL 4 MG/2ML IJ SOLN
4.0000 mg | Freq: Four times a day (QID) | INTRAMUSCULAR | Status: DC | PRN
  Administered 2015-06-05: 4 mg via INTRAVENOUS
  Filled 2015-06-05: qty 2

## 2015-06-05 MED ORDER — OXYTOCIN BOLUS FROM INFUSION
500.0000 mL | INTRAVENOUS | Status: DC
  Administered 2015-06-06: 500 mL via INTRAVENOUS

## 2015-06-05 MED ORDER — FENTANYL CITRATE (PF) 100 MCG/2ML IJ SOLN
50.0000 ug | INTRAMUSCULAR | Status: DC | PRN
Start: 2015-06-05 — End: 2015-06-05

## 2015-06-05 MED ORDER — OXYCODONE-ACETAMINOPHEN 5-325 MG PO TABS: 1.0000 | ORAL_TABLET | ORAL | Status: DC | PRN

## 2015-06-05 MED ORDER — PHENYLEPHRINE 40 MCG/ML (10ML) SYRINGE FOR IV PUSH (FOR BLOOD PRESSURE SUPPORT)
80.0000 ug | PREFILLED_SYRINGE | INTRAVENOUS | Status: DC | PRN
  Filled 2015-06-05: qty 20
  Filled 2015-06-05: qty 2

## 2015-06-05 MED ORDER — ACETAMINOPHEN 325 MG PO TABS
650.0000 mg | ORAL_TABLET | ORAL | Status: DC | PRN
  Administered 2015-06-05: 650 mg via ORAL
  Filled 2015-06-05: qty 2

## 2015-06-05 MED ORDER — PENICILLIN G POTASSIUM 5000000 UNITS IJ SOLR
5.0000 10*6.[IU] | Freq: Once | INTRAVENOUS | Status: AC
  Administered 2015-06-05: 5 10*6.[IU] via INTRAVENOUS
  Filled 2015-06-05: qty 5

## 2015-06-05 MED ORDER — LIDOCAINE HCL (PF) 1 % IJ SOLN
INTRAMUSCULAR | Status: DC | PRN
  Administered 2015-06-05: 3 mL via EPIDURAL
  Administered 2015-06-05: 4 mL via EPIDURAL

## 2015-06-05 MED ORDER — FENTANYL 2.5 MCG/ML BUPIVACAINE 1/10 % EPIDURAL INFUSION (WH - ANES)
14.0000 mL/h | INTRAMUSCULAR | Status: DC | PRN
  Administered 2015-06-05: 11 mL/h via EPIDURAL
  Filled 2015-06-05 (×2): qty 125

## 2015-06-05 MED ORDER — CITRIC ACID-SODIUM CITRATE 334-500 MG/5ML PO SOLN: 30.0000 mL | ORAL | Status: DC | PRN

## 2015-06-05 MED ORDER — EPHEDRINE 5 MG/ML INJ
10.0000 mg | INTRAVENOUS | Status: DC | PRN
  Filled 2015-06-05: qty 2

## 2015-06-05 MED ORDER — LACTATED RINGERS IV SOLN
INTRAVENOUS | Status: DC
  Administered 2015-06-05: 1000 mL via INTRAVENOUS
  Administered 2015-06-05 (×2): via INTRAVENOUS

## 2015-06-05 MED ORDER — FLEET ENEMA 7-19 GM/118ML RE ENEM: 1.0000 | ENEMA | RECTAL | Status: DC | PRN

## 2015-06-05 MED ORDER — LACTATED RINGERS IV SOLN: 500.0000 mL | INTRAVENOUS | Status: DC | PRN

## 2015-06-05 NOTE — Anesthesia Procedure Notes (Signed)
Epidural Patient location during procedure: OB Start time: 06/05/2015 10:48 AM  Staffing Anesthesiologist: Mal Amabile Performed by: anesthesiologist   Preanesthetic Checklist Completed: patient identified, site marked, surgical consent, pre-op evaluation, timeout performed, IV checked, risks and benefits discussed and monitors and equipment checked  Epidural Patient position: sitting Prep: site prepped and draped and DuraPrep Patient monitoring: continuous pulse ox and blood pressure Approach: midline Location: L3-L4 Injection technique: LOR air  Needle:  Needle type: Tuohy  Needle gauge: 17 G Needle length: 9 cm and 9 Needle insertion depth: 4 cm Catheter type: closed end flexible Catheter size: 19 Gauge Catheter at skin depth: 9 cm Test dose: negative and Other  Assessment Events: blood not aspirated, injection not painful, no injection resistance, negative IV test and no paresthesia  Additional Notes Patient identified. Risks and benefits discussed including failed block, incomplete  Pain control, post dural puncture headache, nerve damage, paralysis, blood pressure Changes, nausea, vomiting, reactions to medications-both toxic and allergic and post Partum back pain. All questions were answered. Patient expressed understanding and wished to proceed. Sterile technique was used throughout procedure. Epidural site was Dressed with sterile barrier dressing. No paresthesias, signs of intravascular injection Or signs of intrathecal spread were encountered.  Patient was more comfortable after the epidural was dosed. Please see RN's note for documentation of vital signs and FHR which are stable.

## 2015-06-05 NOTE — Anesthesia Preprocedure Evaluation (Signed)
Anesthesia Evaluation  Patient identified by MRN, date of birth, ID band Patient awake    Reviewed: Allergy & Precautions, H&P , Patient's Chart, lab work & pertinent test results  Airway Mallampati: III  TM Distance: >3 FB Neck ROM: full    Dental no notable dental hx. (+) Teeth Intact   Pulmonary neg pulmonary ROS,  breath sounds clear to auscultation  Pulmonary exam normal       Cardiovascular negative cardio ROS Normal cardiovascular examRhythm:regular Rate:Normal     Neuro/Psych negative neurological ROS  negative psych ROS   GI/Hepatic negative GI ROS, Neg liver ROS,   Endo/Other  negative endocrine ROS  Renal/GU negative Renal ROS  negative genitourinary   Musculoskeletal   Abdominal   Peds  Hematology  (+) anemia ,   Anesthesia Other Findings   Reproductive/Obstetrics (+) Pregnancy                             Anesthesia Physical Anesthesia Plan  ASA: II  Anesthesia Plan: Epidural   Post-op Pain Management:    Induction:   Airway Management Planned:   Additional Equipment:   Intra-op Plan:   Post-operative Plan:   Informed Consent: I have reviewed the patients History and Physical, chart, labs and discussed the procedure including the risks, benefits and alternatives for the proposed anesthesia with the patient or authorized representative who has indicated his/her understanding and acceptance.     Plan Discussed with: Anesthesiologist  Anesthesia Plan Comments:         Anesthesia Quick Evaluation  

## 2015-06-05 NOTE — MAU Note (Signed)
PT SAYS HER LEGS  STARTED  HURTING  BAD AT 0200.    PNC- [facility].   VE -   1-2  CM.   DENIES HSV   AND MRSA   .  AT 0430-  WENT TO B-ROOM-    THINKS  SROM.    GBS- POSITIVE.

## 2015-06-05 NOTE — Progress Notes (Signed)
IV zofran given for N/V

## 2015-06-05 NOTE — H&P (Signed)
Casey Maxwell is a 19 y.o. female G1P0 with IUP at 110w0d presenting for PROM/contractions.  SROM around 0500,. She is starting to contract now.  Fluid is clear. PNCare at [facility] since 8 wks  Prenatal History/Complications:  EFW < 10%, symmetrical, excellent dopplers, normal AFI.  Most likely constitutionally small  Past Medical History: History reviewed. No pertinent past medical history.  Past Surgical History: Past Surgical History  Procedure Laterality Date  . External ear surgery      Obstetrical History: OB History    Gravida Para Term Preterm AB TAB SAB Ectopic Multiple Living   1               Obstetric Comments     Social History: Social History   Social History  . Marital Status: Single    Spouse Name: N/A  . Number of Children: N/A  . Years of Education: N/A   Social History Main Topics  . Smoking status: Never Smoker   . Smokeless tobacco: Never Used  . Alcohol Use: No  . Drug Use: No  . Sexual Activity: Yes    Birth Control/ Protection: None   Other Topics Concern  . None   Social History Narrative    Family History: History reviewed. No pertinent family history.  Allergies: No Known Allergies  Prescriptions prior to admission  Medication Sig Dispense Refill Last Dose  . Prenat-FeFum-FePo-FA-Omega 3 (CONCEPT DHA) 53.5-38-1 MG CAPS Take 1 capsule by mouth daily. 30 capsule 11 Taking     Prenatal Transfer Tool  Maternal Diabetes: No Genetic Screening: Normal Maternal Ultrasounds/Referrals: Normal Fetal Ultrasounds or other Referrals:  None Maternal Substance Abuse:  No Significant Maternal Medications:  None Significant Maternal Lab Results: Lab values include: Group B Strep positive     Review of Systems   Constitutional: Negative for fever and chills Eyes: Negative for visual disturbances Respiratory: Negative for shortness of breath, dyspnea Cardiovascular: Negative for chest pain or palpitations  Gastrointestinal:  Negative for vomiting, diarrhea and constipation.  POSITIVE for abdominal pain (contractions) Genitourinary: Negative for dysuria and urgency Musculoskeletal: Negative for back pain, joint pain, myalgias  Neurological: Negative for dizziness and headaches      Blood pressure 121/75, pulse 84, temperature 98.1 F (36.7 C), temperature source Oral, resp. rate 20, height  (1.448 m), weight 58.06 kg (128 lb), last menstrual period 08/29/2014. General appearance: alert, cooperative and no distress Lungs: clear to auscultation bilaterally Heart: regular rate and rhythm Abdomen: soft, non-tender; bowel sounds normal Pelvic: leaking clear, fern + fluid Extremities: Homans sign is negative, no sign of DVT DTR's 2+ Presentation: cephalic by Leoplod's and by internal exam yesterday Fetal monitoring  Baseline: 140 bpm, Variability: Good {> 6 bpm), Accelerations: Reactive and Decelerations: Absent Uterine activity  Irregular and mild      Prenatal labs: ABO, Rh: A/--/-- (01/28 1126) Antibody: Negative (06/02 0904) Rubella:   RPR: Non Reactive (06/02 0904)  HBsAg: Negative (01/28 1126)  HIV:   neg GBS:   postitive 1 hr Glucola 74/132/98 Genetic screening  Neg NT/IT Anatomy US normal.  EFW < 10%, symmetrical   Results for orders placed or performed in visit on 06/04/15 (from the past 24 hour(s))  POCT urinalysis dipstick   Collection Time: 06/04/15  2:51 PM  Result Value Ref Range   Color, UA     Clarity, UA     Glucose, UA neg    Bilirubin, UA     Ketones, UA neg  Spec Grav, UA     Blood, UA neg    pH, UA     Protein, UA neg    Urobilinogen, UA     Nitrite, UA neg    Leukocytes, UA Trace (A) Negative    Assessment: Casey Maxwell is a 19 y.o. G1P0 with an IUP at [redacted]w[redacted]d presenting for PROM/early labor  Plan: #Labor: will start cytotec PO and/or foley if labor doesn't ensue  #Pain:  Per pt #FWB Cat 1 #ID: GBS: PCN  #MOF:  beast #MOC: Mirena #Circ:  no   Casey Maxwell 06/05/2015, 6:54 AM

## 2015-06-06 ENCOUNTER — Encounter (HOSPITAL_COMMUNITY): Payer: Self-pay

## 2015-06-06 DIAGNOSIS — O4292 Full-term premature rupture of membranes, unspecified as to length of time between rupture and onset of labor: Secondary | ICD-10-CM

## 2015-06-06 DIAGNOSIS — Z3A40 40 weeks gestation of pregnancy: Secondary | ICD-10-CM

## 2015-06-06 DIAGNOSIS — O99824 Streptococcus B carrier state complicating childbirth: Secondary | ICD-10-CM

## 2015-06-06 MED ORDER — OXYCODONE-ACETAMINOPHEN 5-325 MG PO TABS: 2.0000 | ORAL_TABLET | ORAL | Status: DC | PRN

## 2015-06-06 MED ORDER — LANOLIN HYDROUS EX OINT: TOPICAL_OINTMENT | CUTANEOUS | Status: DC | PRN

## 2015-06-06 MED ORDER — BENZOCAINE-MENTHOL 20-0.5 % EX AERO: 1.0000 "application " | INHALATION_SPRAY | CUTANEOUS | Status: DC | PRN

## 2015-06-06 MED ORDER — ACETAMINOPHEN 325 MG PO TABS: 650.0000 mg | ORAL_TABLET | ORAL | Status: DC | PRN

## 2015-06-06 MED ORDER — PRENATAL MULTIVITAMIN CH
1.0000 | ORAL_TABLET | Freq: Every day | ORAL | Status: DC
  Administered 2015-06-06 – 2015-06-07 (×2): 1 via ORAL
  Filled 2015-06-06 (×2): qty 1

## 2015-06-06 MED ORDER — ONDANSETRON HCL 4 MG/2ML IJ SOLN: 4.0000 mg | INTRAMUSCULAR | Status: DC | PRN

## 2015-06-06 MED ORDER — WITCH HAZEL-GLYCERIN EX PADS: 1.0000 "application " | MEDICATED_PAD | CUTANEOUS | Status: DC | PRN

## 2015-06-06 MED ORDER — OXYCODONE-ACETAMINOPHEN 5-325 MG PO TABS
1.0000 | ORAL_TABLET | ORAL | Status: DC | PRN
  Administered 2015-06-06: 1 via ORAL
  Filled 2015-06-06: qty 1

## 2015-06-06 MED ORDER — ZOLPIDEM TARTRATE 5 MG PO TABS: 5.0000 mg | ORAL_TABLET | Freq: Every evening | ORAL | Status: DC | PRN

## 2015-06-06 MED ORDER — SIMETHICONE 80 MG PO CHEW: 80.0000 mg | CHEWABLE_TABLET | ORAL | Status: DC | PRN

## 2015-06-06 MED ORDER — IBUPROFEN 600 MG PO TABS
600.0000 mg | ORAL_TABLET | Freq: Four times a day (QID) | ORAL | Status: DC
  Administered 2015-06-06 – 2015-06-07 (×6): 600 mg via ORAL
  Filled 2015-06-06 (×6): qty 1

## 2015-06-06 MED ORDER — TETANUS-DIPHTH-ACELL PERTUSSIS 5-2.5-18.5 LF-MCG/0.5 IM SUSP: 0.5000 mL | Freq: Once | INTRAMUSCULAR | Status: DC

## 2015-06-06 MED ORDER — ONDANSETRON HCL 4 MG PO TABS: 4.0000 mg | ORAL_TABLET | ORAL | Status: DC | PRN

## 2015-06-06 MED ORDER — DIPHENHYDRAMINE HCL 25 MG PO CAPS: 25.0000 mg | ORAL_CAPSULE | Freq: Four times a day (QID) | ORAL | Status: DC | PRN

## 2015-06-06 MED ORDER — SENNOSIDES-DOCUSATE SODIUM 8.6-50 MG PO TABS
2.0000 | ORAL_TABLET | ORAL | Status: DC
  Administered 2015-06-07: 2 via ORAL
  Filled 2015-06-06: qty 2

## 2015-06-06 MED ORDER — DIBUCAINE 1 % RE OINT: 1.0000 "application " | TOPICAL_OINTMENT | RECTAL | Status: DC | PRN

## 2015-06-06 NOTE — Anesthesia Postprocedure Evaluation (Signed)
  Anesthesia Post-op Note  Patient: Hospital doctor L Cude  Procedure(s) Performed: * No procedures listed *  Patient Location: Mother/Baby  Anesthesia Type:Epidural  Level of Consciousness: awake, alert , oriented and patient cooperative  Airway and Oxygen Therapy: Patient Spontanous Breathing  Post-op Pain: none  Post-op Assessment: Post-op Vital signs reviewed, Patient's Cardiovascular Status Stable, Respiratory Function Stable, Patent Airway, No headache, No backache and Patient able to bend at knees              Post-op Vital Signs: Reviewed and stable  Last Vitals:  Filed Vitals:   06/06/15 0507  BP: 129/71  Pulse: 102  Temp: 37 C  Resp: 18    Complications: No apparent anesthesia complications

## 2015-06-07 MED ORDER — DOCUSATE SODIUM 100 MG PO CAPS: 100.0000 mg | ORAL_CAPSULE | Freq: Two times a day (BID) | ORAL | Status: DC | PRN

## 2015-06-07 MED ORDER — IBUPROFEN 600 MG PO TABS: 600.0000 mg | ORAL_TABLET | Freq: Four times a day (QID) | ORAL | Status: DC

## 2015-06-07 NOTE — Discharge Instructions (Signed)

## 2015-06-07 NOTE — Discharge Summary (Signed)
Obstetric Discharge Summary Reason for Admission: onset of labor Prenatal Procedures: ultrasound Intrapartum Procedures: spontaneous vaginal delivery Postpartum Procedures: none Complications-Operative and Postpartum: none HEMOGLOBIN  Date Value Ref Range Status  06/05/2015 9.2* 12.0 - 15.0 g/dL Final   HCT  Date Value Ref Range Status  06/05/2015 29.8* 36.0 - 46.0 % Final   HEMATOCRIT  Date Value Ref Range Status  03/12/2015 35.0 34.0 - 46.6 % Final    Physical Exam:  General: alert, cooperative and no distress Lochia: appropriate Uterine Fundus: firm Incision: n/a  DVT Evaluation: No evidence of DVT seen on physical exam. Negative Homan's sign. No cords or calf tenderness. No significant calf/ankle edema.  Discharge Diagnoses: Term Pregnancy-delivered  On 06/06/15: Delivery Note At 1:28 AM a viable female was delivered via Vaginal, Spontaneous Delivery (Presentation: ; Occiput Anterior). APGAR: 7,9 ; weight 5 lb 10.8 oz (2575 g).  Placenta status: Intact, Spontaneous. Meconium Stained Cord: 3 vessels with the following complications: Knot. Cord pH: Infant initially floppy; Corc clamped and cut and placed immediately in the warmer. Stimulation given and infant cried.Nursery Personal present  Anesthesia: Epidural  Episiotomy: none Lacerations: none Suture Repair:  Est. Blood Loss (mL): 50   Discharge Information: Date: 06/07/2015 Activity: pelvic rest Diet: routine Medications: Ibuprofen and Colace Condition: stable Instructions: refer to practice specific booklet Discharge to: home Contraception: IUD at [facility]  Newborn Data: Live born female  Birth Weight: 5 lb 10.8 oz (2574 g) APGAR: 7, 9  Home with mother.  Casey Maxwell, Casey Maxwell 06/07/2015, 10:32 AM

## 2015-07-15 ENCOUNTER — Encounter: Payer: Self-pay | Admitting: Advanced Practice Midwife

## 2015-07-15 ENCOUNTER — Ambulatory Visit (INDEPENDENT_AMBULATORY_CARE_PROVIDER_SITE_OTHER): Payer: Medicaid Other | Admitting: Advanced Practice Midwife

## 2015-07-15 NOTE — Progress Notes (Signed)
Casey Maxwell is a 19 y.o. who presents for a postpartum visit. She is 6 weeks postpartum following a spontaneous vaginal delivery. I have fully reviewed the prenatal and intrapartum course. The delivery was at 40.1 gestational weeks.  Anesthesia: epidural. Postpartum course has been uneventful. Baby's course has been uneventful. Baby is feeding by bottle. Bleeding: red. (started period 2 days ago) Bowel function is normal. Bladder function is normal. Patient is not sexually active. Contraception method is none. Postpartum depression screening: equivocal.  Feels overwhelmed, FOB not much help. Sleeps well.  Denies SI/HI.  Accepts offer of counseling referal, may discuss meds with them.   Current outpatient prescriptions:  .  ibuprofen (ADVIL,MOTRIN) 600 MG tablet, Take 1 tablet (600 mg total) by mouth every 6 (six) hours., Disp: 30 tablet, Rfl: 0  Review of Systems   Constitutional: Negative for fever and chills Eyes: Negative for visual disturbances Respiratory: Negative for shortness of breath, dyspnea Cardiovascular: Negative for chest pain or palpitations  Gastrointestinal: Negative for vomiting, diarrhea and constipation Genitourinary: Negative for dysuria and urgency Musculoskeletal: Negative for back pain, joint pain, myalgias  Neurological: Negative for dizziness and headaches   Objective:     Filed Vitals:   07/15/15 0947  BP: 100/80  Pulse: 80   General:  alert, cooperative and no distress   Breasts:  negative  Lungs: clear to auscultation bilaterally  Heart:  regular rate and rhythm  Abdomen: Soft, nontender   Vulva:  normal  Vagina: normal vagina  Cervix:  closed  Corpus: Well involuted     Rectal Exam: no hemorrhoids        Assessment:    normal postpartum exam. Pp depression  Plan:    1. Contraception: IUD 2. Follow up in:  ASAP for Mirena. No sex until then. 3. Referral to [facility] made

## 2015-07-16 ENCOUNTER — Ambulatory Visit: Payer: Medicaid Other | Admitting: Advanced Practice Midwife

## 2015-07-23 ENCOUNTER — Ambulatory Visit: Payer: Medicaid Other | Admitting: Advanced Practice Midwife

## 2015-07-30 ENCOUNTER — Ambulatory Visit (INDEPENDENT_AMBULATORY_CARE_PROVIDER_SITE_OTHER): Payer: Medicaid Other | Admitting: Advanced Practice Midwife

## 2015-07-30 ENCOUNTER — Encounter: Payer: Self-pay | Admitting: Advanced Practice Midwife

## 2015-07-30 VITALS — BP 100/60 | HR 74 | Wt 107.0 lb

## 2015-07-30 DIAGNOSIS — Z3202 Encounter for pregnancy test, result negative: Secondary | ICD-10-CM | POA: Diagnosis not present

## 2015-07-30 DIAGNOSIS — Z30430 Encounter for insertion of intrauterine contraceptive device: Secondary | ICD-10-CM

## 2015-07-30 DIAGNOSIS — Z30014 Encounter for initial prescription of intrauterine contraceptive device: Secondary | ICD-10-CM

## 2015-07-30 LAB — POCT URINE PREGNANCY: Preg Test, Ur: NEGATIVE

## 2015-07-30 NOTE — Progress Notes (Signed)
Casey Maxwell is a 19 y.o. year old  female   who presents for placement of a Mirena IUD. She has been abstinate since delivering her baby and her pregnancy test today is negative.    The risks and benefits of the method and placement have been thouroughly reviewed with the patient and all questions were answered.  Specifically the patient is aware of failure rate of 10/998, expulsion of the IUD and of possible perforation.  The patient is aware of irregular bleeding due to the method and understands the incidence of irregular bleeding diminishes with time.  Time out was performed.  A Graves speculum was placed.  The cervix was prepped using Betadine. The uterus was found to be tilted towards the left and it sounded to 7 cm.  The cervix was grasped with a tenaculum and the IUD was inserted to 7 cm.  It was pulled back 1 cm and the IUD was disengaged.  The strings were trimmed to 3 cm.  Sonogram was performed and the proper placement of the IUD was verified.  The patient was instructed on signs and symptoms of infection and to check for the strings after each menses or each month.   The patient is to refrain from intercourse for 3 days.  The patient is scheduled for a return appointment after her first menses or 4 weeks.  She did not start counseling, but says she is feeling much better.  She has gone back to work, and had a long talk with FOB, who is now helping out more.   CRESENZO-DISHMAN,Amin Fornwalt 07/30/2015 8:56 AM

## 2015-09-01 ENCOUNTER — Ambulatory Visit: Payer: Medicaid Other | Admitting: Advanced Practice Midwife

## 2015-09-01 ENCOUNTER — Encounter: Payer: Self-pay | Admitting: Advanced Practice Midwife

## 2015-09-17 ENCOUNTER — Ambulatory Visit (INDEPENDENT_AMBULATORY_CARE_PROVIDER_SITE_OTHER): Payer: Medicaid Other | Admitting: Advanced Practice Midwife

## 2015-09-17 ENCOUNTER — Other Ambulatory Visit (HOSPITAL_COMMUNITY)
Admission: RE | Admit: 2015-09-17 | Discharge: 2015-09-17 | Disposition: A | Discharge status: Home / Self Care | Payer: Medicaid Other | Source: Ambulatory Visit | Attending: Obstetrics and Gynecology | Admitting: Obstetrics and Gynecology

## 2015-09-17 ENCOUNTER — Encounter: Payer: Self-pay | Admitting: Advanced Practice Midwife

## 2015-09-17 VITALS — BP 112/68 | HR 98 | Ht 59.0 in | Wt 104.0 lb

## 2015-09-17 DIAGNOSIS — Z01419 Encounter for gynecological examination (general) (routine) without abnormal findings: Secondary | ICD-10-CM

## 2015-09-17 DIAGNOSIS — Z309 Encounter for contraceptive management, unspecified: Secondary | ICD-10-CM | POA: Diagnosis not present

## 2015-09-17 DIAGNOSIS — Z01411 Encounter for gynecological examination (general) (routine) with abnormal findings: Secondary | ICD-10-CM | POA: Diagnosis present

## 2015-09-17 NOTE — Progress Notes (Signed)
Casey Maxwell 19 y.o.  Filed Vitals:   09/17/15 1502  BP: 112/68  Pulse: 98     Past Medical History: History reviewed. No pertinent past medical history.  Past Surgical History: Past Surgical History  Procedure Laterality Date  . External ear surgery      Family History: History reviewed. No pertinent family history.  Social History: Social History  Substance Use Topics  . Smoking status: Never Smoker   . Smokeless tobacco: Never Used  . Alcohol Use: No    Allergies: No Known Allergies    Current outpatient prescriptions:  .  ibuprofen (ADVIL,MOTRIN) 600 MG tablet, Take 1 tablet (600 mg total) by mouth every 6 (six) hours., Disp: 30 tablet, Rfl: 0  History of Present Illness: [facility] check , [facility] so [facility].  IUD has been fine--some BTB, none now.  Boyfriend feels strings. He is still helping out with the baby, pt seems happy todya   Review of Systems   Patient denies any headaches, blurred vision, shortness of breath, chest pain, abdominal pain, problems with bowel movements, urination, or intercourse.   Physical Exam: General:  Well developed, well nourished, no acute distress Skin:  Warm and dry Neck:  Midline trachea, normal thyroid Lungs; Clear to auscultation bilaterally Cardiovascular: Regular rate and rhythm Abdomen:  Soft, non tender, no hepatosplenomegaly Pelvic:  External genitalia is normal in appearance.  The vagina is normal in appearance.  The cervix is bulbous. IUD strings properly wrapped around cx. Uterus is felt to be normal size, shape, and contour.  No adnexal masses or tenderness noted.  Extremities:  No swelling or varicosities noted Psych:  No mood changes.     Impression: Normal GYn exam, IUD check     Plan: Pap q 3 years if normal.

## 2015-09-21 LAB — CYTOLOGY - PAP

## 2015-09-29 ENCOUNTER — Encounter: Payer: Self-pay | Admitting: Advanced Practice Midwife

## 2015-10-28 ENCOUNTER — Ambulatory Visit (INDEPENDENT_AMBULATORY_CARE_PROVIDER_SITE_OTHER): Payer: Medicaid Other | Admitting: Obstetrics & Gynecology

## 2015-10-28 ENCOUNTER — Encounter: Payer: Self-pay | Admitting: Obstetrics & Gynecology

## 2015-10-28 VITALS — BP 102/80 | HR 72 | Wt 99.0 lb

## 2015-10-28 DIAGNOSIS — IMO0002 Reserved for concepts with insufficient information to code with codable children: Secondary | ICD-10-CM

## 2015-10-28 DIAGNOSIS — R87612 Low grade squamous intraepithelial lesion on cytologic smear of cervix (LGSIL): Secondary | ICD-10-CM

## 2015-10-28 DIAGNOSIS — R8789 Other abnormal findings in specimens from female genital organs: Secondary | ICD-10-CM

## 2015-10-28 NOTE — Progress Notes (Signed)
Patient ID: Casey Maxwell, female   DOB: [birth year], 20 y.o.   MRN: [id number] Colposcopy Procedure Note:  Colposcopy Procedure Note  Indications: Pap smear 1 months ago showed: low-grade squamous intraepithelial neoplasia (LGSIL - encompassing HPV,mild dysplasia,CIN I). The prior pap showed .  Prior cervical/vaginal disease:  First Pap. Prior cervical treatment: none .  Smoker:  No. New sexual partner:  Yes.    : time frame:  2 years  History of abnormal Pap: 1st Pap  Procedure Details  The risks and benefits of the procedure and Written informed consent obtained.  Speculum placed in vagina and excellent visualization of cervix achieved, cervix swabbed x 3 with acetic acid solution.  Findings: Cervix: HPV changes noted at 12 o'clock; no biopsies taken. Vaginal inspection: normal without visible lesions. Vulvar colposcopy: vulvar colposcopy not performed.  Specimens:   Complications: none.  Plan: Follow up Pap in 1 year

## 2016-01-01 ENCOUNTER — Emergency Department (HOSPITAL_COMMUNITY): Payer: Self-pay

## 2016-01-01 ENCOUNTER — Emergency Department (HOSPITAL_COMMUNITY)
Admission: EM | Admit: 2016-01-01 | Discharge: 2016-01-01 | Disposition: A | Discharge status: Home / Self Care | Payer: Self-pay | Attending: Emergency Medicine | Admitting: Emergency Medicine

## 2016-01-01 ENCOUNTER — Encounter (HOSPITAL_COMMUNITY): Payer: Self-pay

## 2016-01-01 DIAGNOSIS — R0981 Nasal congestion: Secondary | ICD-10-CM | POA: Insufficient documentation

## 2016-01-01 DIAGNOSIS — Z9889 Other specified postprocedural states: Secondary | ICD-10-CM | POA: Insufficient documentation

## 2016-01-01 DIAGNOSIS — H6991 Unspecified Eustachian tube disorder, right ear: Secondary | ICD-10-CM | POA: Insufficient documentation

## 2016-01-01 DIAGNOSIS — H6981 Other specified disorders of Eustachian tube, right ear: Secondary | ICD-10-CM

## 2016-01-01 MED ORDER — PREDNISONE 10 MG PO TABS: ORAL_TABLET | ORAL | Status: DC

## 2016-01-01 MED ORDER — LORATADINE-PSEUDOEPHEDRINE ER 5-120 MG PO TB12: 1.0000 | ORAL_TABLET | Freq: Two times a day (BID) | ORAL | Status: DC

## 2016-01-01 NOTE — ED Notes (Signed)
J. Idol, PA at bedside. 

## 2016-01-01 NOTE — ED Notes (Signed)
Pt reports she was sick with cold symptoms last week. Right ear has been hurting for 2 days. Hurts worse at night and when she swallowa

## 2016-01-01 NOTE — ED Notes (Signed)
Pt with c/o right ear pain, productive cough of green in color mucus per pt and sore throat

## 2016-01-02 NOTE — ED Provider Notes (Signed)
CSN: [id number]     Arrival date & time 01/01/16  1027 History   First MD Initiated Contact with Patient 01/01/16 1133     Chief Complaint  Patient presents with  . Otalgia     (Consider location/radiation/quality/duration/timing/severity/associated sxs/prior Treatment) The history is provided by the patient.   Casey Maxwell is a 20 y.o. female presenting with a 2 day history of right ear pain preceeded by uri symptoms last week which included non productive cough, nasal congestion , sore throat and post nasal drip with green mucus production which has now improved. She denies sob, fevers, chills, hemoptysis or epistaxis and denies any residual facial or sinus pain.  She reports her hearing is muffled on her right side. She denies drainage from the ear.  She has found no alleviators including motrin and tylenol.      History reviewed. No pertinent past medical history. Past Surgical History  Procedure Laterality Date  . External ear surgery     No family history on file. Social History  Substance Use Topics  . Smoking status: Never Smoker   . Smokeless tobacco: Never Used  . Alcohol Use: No    Review of Systems  Constitutional: Negative for fever and chills.  HENT: Positive for congestion, ear pain, hearing loss, postnasal drip, rhinorrhea and sore throat. Negative for ear discharge, facial swelling, sinus pressure, trouble swallowing and voice change.   Eyes: Negative for discharge.  Respiratory: Positive for cough. Negative for shortness of breath, wheezing and stridor.   Cardiovascular: Negative for chest pain.  Gastrointestinal: Negative for abdominal pain.  Genitourinary: Negative.       Allergies  Review of patient's allergies indicates no known allergies.  Home Medications    BP 108/31 mmHg  Pulse 93  Temp(Src) 98.5 F (36.9 C) (Oral)  Resp 18  Ht 4\' 11"  (1.499 m)  Wt 48.535 kg  BMI 21.60 kg/m2  SpO2 100%  LMP 12/08/2015  Breastfeeding? No Physical  Exam  Constitutional: She is oriented to person, place, and time. She appears well-developed and well-nourished.  HENT:  Head: Normocephalic and atraumatic.  Right Ear: Ear canal normal. No drainage. No foreign bodies. No mastoid tenderness. Tympanic membrane is retracted. Tympanic membrane is not injected and not erythematous. No middle ear effusion. No hemotympanum. Decreased hearing is noted.  Left Ear: Tympanic membrane and ear canal normal.  Nose: No mucosal edema or rhinorrhea.  Mouth/Throat: Uvula is midline, oropharynx is clear and moist and mucous membranes are normal. No oropharyngeal exudate, posterior oropharyngeal edema, posterior oropharyngeal erythema or tonsillar abscesses.  No sinus tenderness.  Eyes: Conjunctivae are normal.  Neck: Neck supple.  Cardiovascular: Normal rate and normal heart sounds.   Pulmonary/Chest: Effort normal. No respiratory distress. She has no wheezes. She has no rales.  Lymphadenopathy:    She has no cervical adenopathy.  Neurological: She is alert and oriented to person, place, and time.  Skin: Skin is warm and dry. No rash noted.  Psychiatric: She has a normal mood and affect.    ED Course  Procedures (including critical care time) Labs Review Labs Reviewed - No data to display  Imaging Review Dg Chest 2 View  01/01/2016  CLINICAL DATA:  Productive cough for a few weeks EXAM: CHEST  2 VIEW COMPARISON:  None. FINDINGS: The heart size and mediastinal contours are within normal limits. Both lungs are clear. The visualized skeletal structures are unremarkable. IMPRESSION: No active cardiopulmonary disease. Electronically Signed   By: Alcide CleverMark  Lukens  M.D.   On: 01/01/2016 11:36   I have personally reviewed and evaluated these images and lab results as part of my medical decision-making.   EKG Interpretation None      MDM   Final diagnoses:  Nasal congestion  Eustachian tube dysfunction, right    Pt with uri/congestion with right  eustachian dysfunction.  Prescribed prednisone for anti inflammatory effect.  Claritin d.  Pt takes prenatal vitamins for iron as she has been anemic since her pregnancy last fall.  She has IUD, not pregnant, not breast feeding.    Casey Amor, PA-C 01/03/16 0813  Azalia Bilis, MD 01/03/16 [phone]

## 2016-09-18 ENCOUNTER — Encounter (HOSPITAL_COMMUNITY): Payer: Self-pay | Admitting: Emergency Medicine

## 2016-09-18 ENCOUNTER — Emergency Department (HOSPITAL_COMMUNITY)
Admission: EM | Admit: 2016-09-18 | Discharge: 2016-09-18 | Disposition: A | Discharge status: Home / Self Care | Payer: Medicaid Other | Attending: Emergency Medicine | Admitting: Emergency Medicine

## 2016-09-18 DIAGNOSIS — N898 Other specified noninflammatory disorders of vagina: Secondary | ICD-10-CM | POA: Diagnosis not present

## 2016-09-18 DIAGNOSIS — Z79899 Other long term (current) drug therapy: Secondary | ICD-10-CM | POA: Diagnosis not present

## 2016-09-18 LAB — URINALYSIS, ROUTINE W REFLEX MICROSCOPIC
Bilirubin Urine: NEGATIVE
Glucose, UA: NEGATIVE mg/dL
Hgb urine dipstick: NEGATIVE
Ketones, ur: NEGATIVE mg/dL
Leukocytes, UA: NEGATIVE
Nitrite: NEGATIVE
Protein, ur: NEGATIVE mg/dL
Specific Gravity, Urine: 1.008 (ref 1.005–1.030)
pH: 7 (ref 5.0–8.0)

## 2016-09-18 LAB — WET PREP, GENITAL
Clue Cells Wet Prep HPF POC: NONE SEEN
Sperm: NONE SEEN
Trich, Wet Prep: NONE SEEN
Yeast Wet Prep HPF POC: NONE SEEN

## 2016-09-18 LAB — PREGNANCY, URINE: Preg Test, Ur: NEGATIVE

## 2016-09-18 MED ORDER — AZITHROMYCIN 250 MG PO TABS
1000.0000 mg | ORAL_TABLET | Freq: Once | ORAL | Status: AC
  Administered 2016-09-18: 1000 mg via ORAL
  Filled 2016-09-18: qty 4

## 2016-09-18 MED ORDER — LIDOCAINE HCL (PF) 1 % IJ SOLN
INTRAMUSCULAR | Status: AC
  Filled 2016-09-18: qty 5

## 2016-09-18 MED ORDER — CEFTRIAXONE SODIUM 250 MG IJ SOLR
250.0000 mg | Freq: Once | INTRAMUSCULAR | Status: AC
  Administered 2016-09-18: 250 mg via INTRAMUSCULAR
  Filled 2016-09-18: qty 250

## 2016-09-18 NOTE — ED Triage Notes (Signed)
PT c/o increased vaginally bleeding with pink tinged discharge and odor x5 moths. PT denies any urinary symptoms or pain. PT denies any vaginal bleeding today. PT also states her partner has had other sexual partners and wants to be checked for STD.

## 2016-09-20 LAB — GC/CHLAMYDIA PROBE AMP (~~LOC~~) NOT AT ARMC
Chlamydia: NEGATIVE
Neisseria Gonorrhea: NEGATIVE
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
## 2016-09-26 ENCOUNTER — Ambulatory Visit: Payer: Medicaid Other | Admitting: Obstetrics & Gynecology

## 2016-09-29 ENCOUNTER — Encounter: Payer: Self-pay | Admitting: Advanced Practice Midwife

## 2016-09-29 ENCOUNTER — Ambulatory Visit (INDEPENDENT_AMBULATORY_CARE_PROVIDER_SITE_OTHER): Payer: Medicaid Other | Admitting: Advanced Practice Midwife

## 2016-09-29 VITALS — BP 102/64 | HR 92 | Ht <= 58 in | Wt 94.5 lb

## 2016-09-29 DIAGNOSIS — N938 Other specified abnormal uterine and vaginal bleeding: Secondary | ICD-10-CM

## 2016-09-29 DIAGNOSIS — Z3202 Encounter for pregnancy test, result negative: Secondary | ICD-10-CM

## 2016-09-29 DIAGNOSIS — Z975 Presence of (intrauterine) contraceptive device: Secondary | ICD-10-CM | POA: Diagnosis not present

## 2016-09-29 DIAGNOSIS — N921 Excessive and frequent menstruation with irregular cycle: Secondary | ICD-10-CM

## 2016-09-29 LAB — POCT URINE PREGNANCY: Preg Test, Ur: NEGATIVE

## 2016-09-29 MED ORDER — MEGESTROL ACETATE 40 MG PO TABS: ORAL_TABLET | ORAL | 3 refills | Status: DC

## 2016-09-29 NOTE — Progress Notes (Signed)
[facility] Visit  Patient name: Casey Maxwell MRN [id number]  Date of birth: 04/10/1996  CC & HPI:  Casey Maxwell is a 20 y.o.  female presenting today for BTB on Mirena IUD. She has had it for 15 months, bleeding off and on since June.  From spotting to heavy.  Went to ED last week, had pelvic exam and STD screen. All negative. Barely bleeding now.  Wants to keep IUD.    Pertinent History Reviewed:  Medical & Surgical Hx:   History reviewed. No pertinent past medical history. Past Surgical History:  Procedure Laterality Date  . EXTERNAL EAR SURGERY     History reviewed. No pertinent family history.  Current Outpatient Prescriptions:  .  levonorgestrel (MIRENA) 20 MCG/24HR IUD, 1 each by Intrauterine route once., Disp: , Rfl:  .  Prenatal Vit-Fe Fumarate-FA (MULTIVITAMIN-PRENATAL) 27-0.8 MG TABS tablet, Take 1 tablet by mouth daily at 12 noon., Disp: , Rfl:  .  megestrol (MEGACE) 40 MG tablet, Take 1-3 a day prn bleeding., Disp: 60 tablet, Rfl: 3 Social History: Reviewed -  reports that she has never smoked. She has never used smokeless tobacco.  Review of Systems:   Constitutional: Negative for fever and chills Eyes: Negative for visual disturbances Respiratory: Negative for shortness of breath, dyspnea Cardiovascular: Negative for chest pain or palpitations  Gastrointestinal: Negative for vomiting, diarrhea and constipation; no abdominal pain Genitourinary: Negative for dysuria and urgency, vaginal irritation or itching Musculoskeletal: Negative for back pain, joint pain, myalgias  Neurological: Negative for dizziness and headaches    Objective Findings:    Physical Examination: General appearance - well appearing, and in no distress Mental status - alert, oriented to person, place, and time Chest:  Normal respiratory effort Heart - normal rate and regular rhythm Abdomen:  Soft, nontender Musculoskeletal:  Normal range of motion without pain Extremities:  No  edema    Results for orders placed or performed in visit on 09/29/16 (from the past 24 hour(s))  POCT urine pregnancy   Collection Time: 09/29/16  4:06 PM  Result Value Ref Range   Preg Test, Ur Negative Negative      Assessment & Plan:  A:   BTB on IUD P:  Megace prn   Return if symptoms worsen or fail to improve.  CRESENZO-DISHMAN,Zayd Bonet CNM 09/29/2016 4:21 PM

## 2016-12-20 ENCOUNTER — Ambulatory Visit (INDEPENDENT_AMBULATORY_CARE_PROVIDER_SITE_OTHER): Payer: Medicaid Other | Admitting: Advanced Practice Midwife

## 2016-12-20 ENCOUNTER — Encounter: Payer: Self-pay | Admitting: Advanced Practice Midwife

## 2016-12-20 VITALS — BP 100/70 | HR 99 | Wt 95.0 lb

## 2016-12-20 DIAGNOSIS — R252 Cramp and spasm: Secondary | ICD-10-CM | POA: Diagnosis not present

## 2016-12-20 DIAGNOSIS — N941 Unspecified dyspareunia: Secondary | ICD-10-CM | POA: Diagnosis not present

## 2016-12-20 DIAGNOSIS — Z975 Presence of (intrauterine) contraceptive device: Secondary | ICD-10-CM

## 2016-12-20 DIAGNOSIS — N938 Other specified abnormal uterine and vaginal bleeding: Secondary | ICD-10-CM

## 2016-12-20 DIAGNOSIS — N921 Excessive and frequent menstruation with irregular cycle: Secondary | ICD-10-CM

## 2016-12-20 MED ORDER — MISOPROSTOL 200 MCG PO TABS: ORAL_TABLET | ORAL | 0 refills | Status: DC

## 2016-12-20 MED ORDER — NORETHIN-ETH ESTRAD-FE BIPHAS 1 MG-10 MCG / 10 MCG PO TABS: 1.0000 | ORAL_TABLET | Freq: Every day | ORAL | 11 refills | Status: DC

## 2016-12-20 NOTE — Progress Notes (Signed)
[facility] Visit  Patient name: Casey Maxwell MRN [id number]  Date of birth: [birth year]  CC & HPI:  Casey Maxwell is a 21 y.o. Caucasian female presenting today for IUD problems.  She finally quit bleeding, but now it hurts during sex and she cramps a lot.  Will get it takne out, but will start on COCs first  Pertinent History Reviewed:  Medical & Surgical Hx:   History reviewed. No pertinent past medical history. Past Surgical History:  Procedure Laterality Date  . EXTERNAL EAR SURGERY     History reviewed. No pertinent family history.  Current Outpatient Prescriptions:  .  levonorgestrel (MIRENA) 20 MCG/24HR IUD, 1 each by Intrauterine route once., Disp: , Rfl:  .  megestrol (MEGACE) 40 MG tablet, Take 1-3 a day prn bleeding., Disp: 60 tablet, Rfl: 3 .  misoprostol (CYTOTEC) 200 MCG tablet, take two tablets 4-6 hours prior to your next clinic appointment, Disp: 2 tablet, Rfl: 0 .  Norethindrone-Ethinyl Estradiol-Fe Biphas (LO LOESTRIN FE) 1 MG-10 MCG / 10 MCG tablet, Take 1 tablet by mouth daily., Disp: 1 Package, Rfl: 11 Social History: Reviewed -  reports that she has never smoked. She has never used smokeless tobacco.  Review of Systems:   Constitutional: Negative for fever and chills Eyes: Negative for visual disturbances Respiratory: Negative for shortness of breath, dyspnea Cardiovascular: Negative for chest pain or palpitations  Gastrointestinal: Negative for vomiting, diarrhea and constipation; no abdominal pain Genitourinary: Negative for dysuria and urgency, vaginal irritation or itching Musculoskeletal: Negative for back pain, joint pain, myalgias  Neurological: Negative for dizziness and headaches    Objective Findings:    Physical Examination: General appearance - well appearing, and in no distress Mental status - alert, oriented to person, place, and time Chest:  Normal respiratory effort Heart - normal rate and regular rhythm Abdomen:  Soft,  nontender Pelvic: deferred, strings were not visible last exm Musculoskeletal:  Normal range of motion without pain Extremities:  No edema    No results found for this or any previous visit (from the past 24 hour(s)).    Assessment & Plan:  A:   IUD pain P:   Meds ordered this encounter  Medications  . Norethindrone-Ethinyl Estradiol-Fe Biphas (LO LOESTRIN FE) 1 MG-10 MCG / 10 MCG tablet    Sig: Take 1 tablet by mouth daily.    Dispense:  1 Package    Refill:  11    Order Specific Question:   Supervising Provider    Answer:   Despina HiddenEURE, LUTHER H [[id number]]  . misoprostol (CYTOTEC) 200 MCG tablet    Sig: take two tablets 4-6 hours prior to your next clinic appointment    Dispense:  2 tablet    Refill:  0    Order Specific Question:   Supervising Provider    Answer:   Lazaro ArmsEURE, LUTHER H [[id number]]      Return in about 3 weeks (around 01/10/2017) for IUD removal.  CRESENZO-DISHMAN,Casey Maxwell CNM 12/20/2016 4:25 PM

## 2016-12-20 NOTE — Patient Instructions (Signed)
Oral Contraception Information Oral contraceptive pills (OCPs) are medicines taken to prevent pregnancy. OCPs work by preventing the ovaries from releasing eggs. The hormones in OCPs also cause the cervical mucus to thicken, preventing the sperm from entering the uterus. The hormones also cause the uterine lining to become thin, not allowing a fertilized egg to attach to the inside of the uterus. OCPs are highly effective when taken exactly as prescribed. However, OCPs do not prevent sexually transmitted diseases (STDs). Safe sex practices, such as using condoms along with the pill, can help prevent STDs. Before taking the pill, you may have a physical exam and Pap test. Your health care provider may order blood tests. The health care provider will make sure you are a good candidate for oral contraception. Discuss with your health care provider the possible side effects of the OCP you may be prescribed. When starting an OCP, it can take 2 to 3 months for the body to adjust to the changes in hormone levels in your body. Types of oral contraception  The combination pill-This pill contains estrogen and progestin (synthetic progesterone) hormones. The combination pill comes in 21-day, 28-day, or 91-day packs. Some types of combination pills are meant to be taken continuously (365-day pills). With 21-day packs, you do not take pills for 7 days after the last pill. With 28-day packs, the pill is taken every day. The last 7 pills are without hormones. Certain types of pills have more than 21 hormone-containing pills. With 91-day packs, the first 84 pills contain both hormones, and the last 7 pills contain no hormones or contain estrogen only.  The minipill-This pill contains the progesterone hormone only. The pill is taken every day continuously. It is very important to take the pill at the same time each day. The minipill comes in packs of 28 pills. All 28 pills contain the hormone. Advantages of oral  contraceptive pills  Decreases premenstrual symptoms.  Treats menstrual period cramps.  Regulates the menstrual cycle.  Decreases a heavy menstrual flow.  May treatacne, depending on the type of pill.  Treats abnormal uterine bleeding.  Treats polycystic ovarian syndrome.  Treats endometriosis.  Can be used as emergency contraception. Things that can make oral contraceptive pills less effective OCPs can be less effective if:  You forget to take the pill at the same time every day.  You have a stomach or intestinal disease that lessens the absorption of the pill.  You take OCPs with other medicines that make OCPs less effective, such as antibiotics, certain HIV medicines, and some seizure medicines.  You take expired OCPs.  You forget to restart the pill on day 7, when using the packs of 21 pills. Risks associated with oral contraceptive pills Oral contraceptive pills can sometimes cause side effects, such as:  Headache.  Nausea.  Breast tenderness.  Irregular bleeding or spotting. Combination pills are also associated with a small increased risk of:  Blood clots.  Heart attack.  Stroke. This information is not intended to replace advice given to you by your health care provider. Make sure you discuss any questions you have with your health care provider. Document Released: 12/17/2002 Document Revised: 03/03/2016 Document Reviewed: 03/17/2013 Elsevier Interactive Patient Education  2017 [employer]  

## 2017-01-11 ENCOUNTER — Encounter: Payer: Self-pay | Admitting: Advanced Practice Midwife

## 2017-01-11 ENCOUNTER — Ambulatory Visit (INDEPENDENT_AMBULATORY_CARE_PROVIDER_SITE_OTHER): Payer: Medicaid Other | Admitting: Advanced Practice Midwife

## 2017-01-11 VITALS — BP 100/60 | HR 80 | Wt 95.0 lb

## 2017-01-11 DIAGNOSIS — Z30432 Encounter for removal of intrauterine contraceptive device: Secondary | ICD-10-CM

## 2017-01-11 NOTE — Progress Notes (Signed)
  Casey Maxwell 20 y.o.  Vitals:   01/11/17 1614  BP: 100/60  Pulse: 80   History reviewed. No pertinent past medical history. Past Surgical History:  Procedure Laterality Date  . EXTERNAL EAR SURGERY     family history is not on file.  Current Outpatient Prescriptions:  .  levonorgestrel (MIRENA) 20 MCG/24HR IUD, 1 each by Intrauterine route once., Disp: , Rfl:  .  Norethindrone-Ethinyl Estradiol-Fe Biphas (LO LOESTRIN FE) 1 MG-10 MCG / 10 MCG tablet, Take 1 tablet by mouth daily., Disp: 1 Package, Rfl: 11 .  megestrol (MEGACE) 40 MG tablet, Take 1-3 a day prn bleeding. (Patient not taking: Reported on 01/11/2017), Disp: 60 tablet, Rfl: 3 .  misoprostol (CYTOTEC) 200 MCG tablet, take two tablets 4-6 hours prior to your next clinic appointment (Patient not taking: Reported on 01/11/2017), Disp: 2 tablet, Rfl: 0    Here for IUD removal.  She had the Mirena IUD placed 2016 and would like it removed because of continued cramps/pain w/ses. Her plans for future contraception are COCs, which she started last monght. Having daily BTB.  A graves speculum was placed, and the strings were visible.  They were grasped with a curved Tresa Endo and the IUD easily removed.  Pt given IUD removal f/u instructions.   If still BTB after 3rd pack will change pills

## 2017-03-21 ENCOUNTER — Emergency Department (HOSPITAL_COMMUNITY): Payer: Medicaid Other

## 2017-03-21 ENCOUNTER — Emergency Department (HOSPITAL_COMMUNITY)
Admission: EM | Admit: 2017-03-21 | Discharge: 2017-03-21 | Disposition: A | Discharge status: Home Health | Payer: Medicaid Other | Attending: Emergency Medicine | Admitting: Emergency Medicine

## 2017-03-21 ENCOUNTER — Encounter (HOSPITAL_COMMUNITY): Payer: Self-pay

## 2017-03-21 DIAGNOSIS — Y999 Unspecified external cause status: Secondary | ICD-10-CM | POA: Diagnosis not present

## 2017-03-21 DIAGNOSIS — Y92019 Unspecified place in single-family (private) house as the place of occurrence of the external cause: Secondary | ICD-10-CM | POA: Insufficient documentation

## 2017-03-21 DIAGNOSIS — Z79818 Long term (current) use of other agents affecting estrogen receptors and estrogen levels: Secondary | ICD-10-CM | POA: Insufficient documentation

## 2017-03-21 DIAGNOSIS — M25572 Pain in left ankle and joints of left foot: Secondary | ICD-10-CM | POA: Diagnosis present

## 2017-03-21 DIAGNOSIS — L559 Sunburn, unspecified: Secondary | ICD-10-CM

## 2017-03-21 DIAGNOSIS — W19XXXA Unspecified fall, initial encounter: Secondary | ICD-10-CM | POA: Insufficient documentation

## 2017-03-21 DIAGNOSIS — Y939 Activity, unspecified: Secondary | ICD-10-CM | POA: Diagnosis not present

## 2017-03-21 DIAGNOSIS — S8012XA Contusion of left lower leg, initial encounter: Secondary | ICD-10-CM | POA: Insufficient documentation

## 2017-03-21 NOTE — ED Triage Notes (Signed)
Pt reports got too much sun on her legs this weekend and now ankles are swollen, left worse than right.
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
## 2017-05-22 ENCOUNTER — Ambulatory Visit: Payer: Medicaid Other | Admitting: Adult Health

## 2017-05-24 ENCOUNTER — Ambulatory Visit: Payer: Medicaid Other | Admitting: Adult Health

## 2017-05-24 ENCOUNTER — Encounter: Payer: Self-pay | Admitting: Adult Health

## 2017-11-23 IMAGING — CR DG TIBIA/FIBULA 2V*L*
2 series · 2 of 2 positions shown · non-contrast
Comparison: None.

CLINICAL DATA: Pain and swelling of the lower anterior tibia and
fibula.

EXAM:
LEFT TIBIA AND FIBULA - 2 VIEW

[x tib-fib ap left]
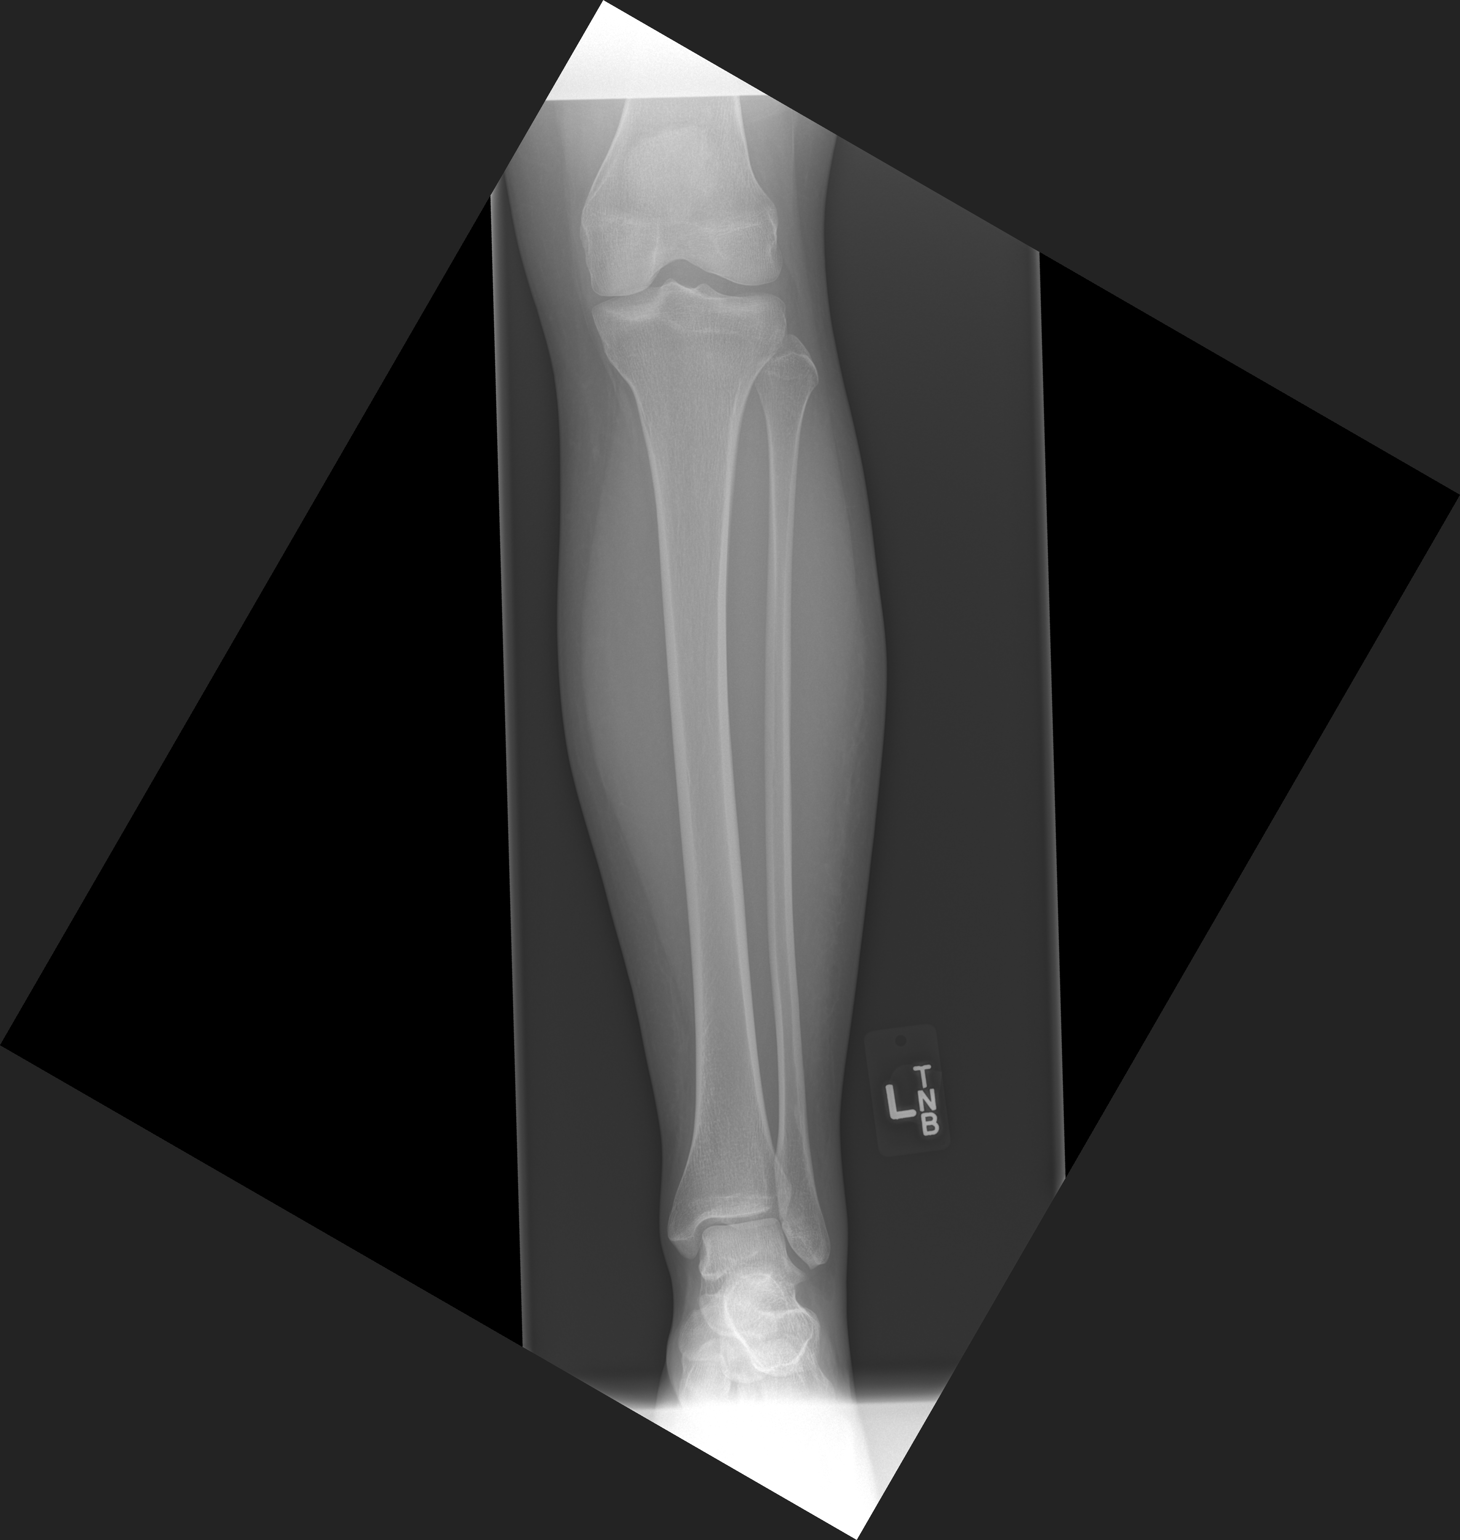

[x tib-fib lat left]
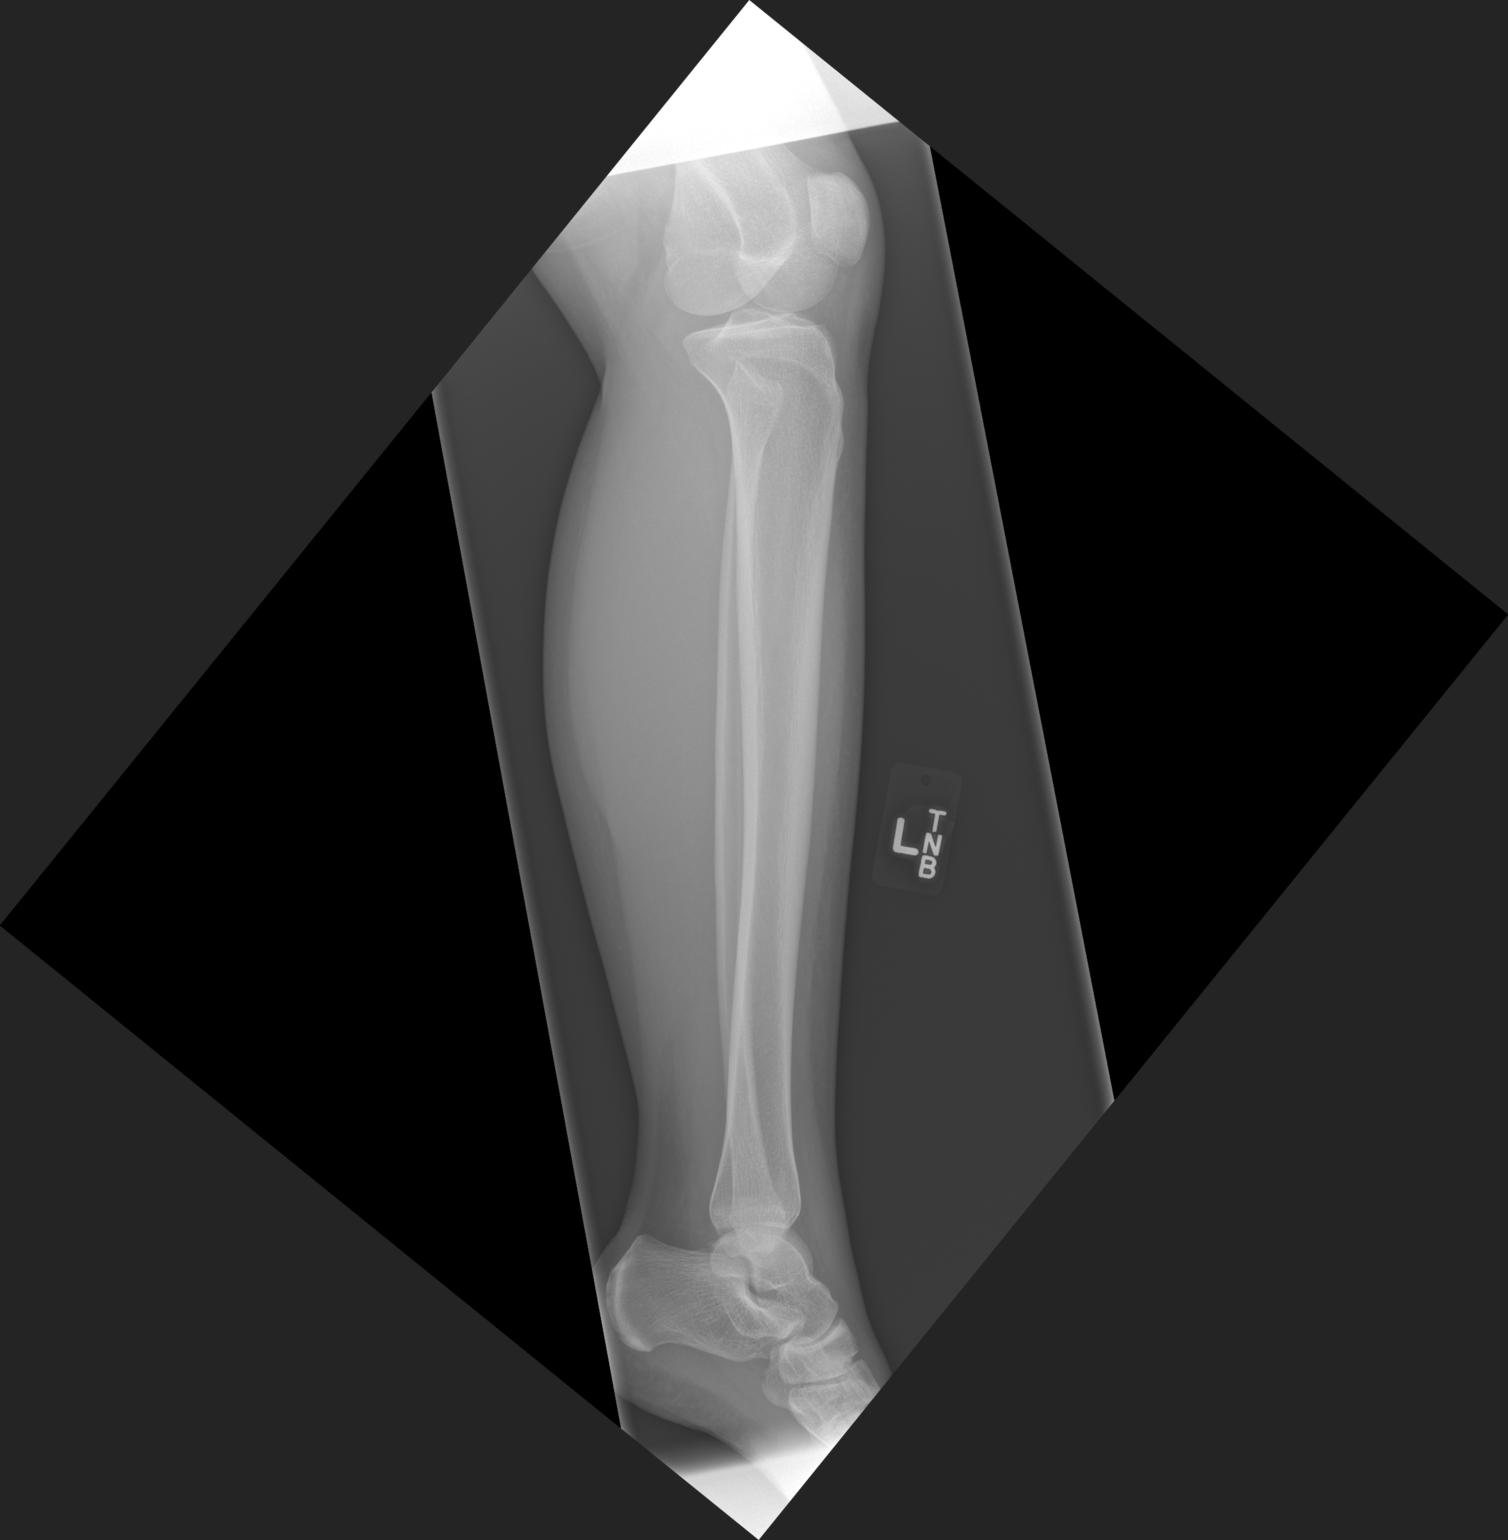

[2 of 2 positions shown; findings below may reference images not displayed]

FINDINGS: There is no evidence of fracture or other focal bone lesions. No
joint dislocations at the knee nor tibiotalar joint. The included
subtalar and midfoot articulations are unremarkable. Slight soft
tissue swelling along the anterolateral aspect of the distal left
leg the level of the distal diaphysis of the tibia and fibula.
IMPRESSION: Nonspecific mild soft tissue swelling of the distal left leg antral
laterally. No underlying osseous abnormality.

## 2017-12-21 ENCOUNTER — Other Ambulatory Visit: Payer: Self-pay | Admitting: Advanced Practice Midwife

## 2018-01-03 ENCOUNTER — Other Ambulatory Visit (HOSPITAL_COMMUNITY)
Admission: RE | Admit: 2018-01-03 | Discharge: 2018-01-03 | Disposition: A | Discharge status: Home / Self Care | Payer: Self-pay | Source: Ambulatory Visit | Attending: Advanced Practice Midwife | Admitting: Advanced Practice Midwife

## 2018-01-03 ENCOUNTER — Ambulatory Visit (INDEPENDENT_AMBULATORY_CARE_PROVIDER_SITE_OTHER): Payer: Medicaid Other | Admitting: Advanced Practice Midwife

## 2018-01-03 ENCOUNTER — Encounter: Payer: Self-pay | Admitting: Advanced Practice Midwife

## 2018-01-03 VITALS — BP 90/60 | HR 95 | Ht 59.0 in | Wt 98.0 lb

## 2018-01-03 DIAGNOSIS — R87612 Low grade squamous intraepithelial lesion on cytologic smear of cervix (LGSIL): Secondary | ICD-10-CM

## 2018-01-03 DIAGNOSIS — Z01419 Encounter for gynecological examination (general) (routine) without abnormal findings: Secondary | ICD-10-CM | POA: Insufficient documentation

## 2018-01-03 DIAGNOSIS — Z309 Encounter for contraceptive management, unspecified: Secondary | ICD-10-CM | POA: Diagnosis not present

## 2018-01-03 DIAGNOSIS — N870 Mild cervical dysplasia: Secondary | ICD-10-CM | POA: Insufficient documentation

## 2018-01-03 MED ORDER — NORETHIN ACE-ETH ESTRAD-FE 1-20 MG-MCG(24) PO CAPS: 1.0000 | ORAL_CAPSULE | Freq: Every day | ORAL | 11 refills | Status: DC

## 2018-01-03 NOTE — Patient Instructions (Signed)
If this pap is normal, you will need 2 more yearly paps before you can go to the three year schedule. If it is abnormal, we will discuss treatment options at that time.

## 2018-01-03 NOTE — Progress Notes (Signed)
Casey Maxwell 21 y.o.  Vitals:   01/03/18 1541  BP: 90/60  Pulse: 95     Filed Weights   01/03/18 1541  Weight: 98 lb (44.5 kg)    Past Medical History: History reviewed. No pertinent past medical history.  Past Surgical History: Past Surgical History:  Procedure Laterality Date  . EXTERNAL EAR SURGERY      Family History: History reviewed. No pertinent family history.  Social History: Social History   Tobacco Use  . Smoking status: Never Smoker  . Smokeless tobacco: Never Used  Substance Use Topics  . Alcohol use: Yes    Comment: occ  . Drug use: No    Allergies: No Known Allergies    Current Outpatient Medications:  .  LO LOESTRIN FE 1 MG-10 MCG / 10 MCG tablet, TAKE 1 TABLET BY MOUTH ONCE DAILY, Disp: 28 tablet, Rfl: 11 .  levonorgestrel (MIRENA) 20 MCG/24HR IUD, 1 each by Intrauterine route once., Disp: , Rfl:  .  megestrol (MEGACE) 40 MG tablet, Take 1-3 a day prn bleeding. (Patient not taking: Reported on 01/11/2017), Disp: 60 tablet, Rfl: 3 .  misoprostol (CYTOTEC) 200 MCG tablet, take two tablets 4-6 hours prior to your next clinic appointment (Patient not taking: Reported on 01/11/2017), Disp: 2 tablet, Rfl: 0  History of Present Illness: here for pap/physical. Colpo 10/28/15 following LSIL:  Cervix: HPV changes noted at 12 o'clock; no biopsies taken. Vaginal inspection: normal without visible lesions. Vulvar colposcopy: vulvar colposcopy not performed.  Plan: Follow up Pap in 1 year  (Did not f/u for pap last January) [facility], still has some BTB on Lo[facility].   Review of Systems   Patient denies any headaches, blurred vision, shortness of breath, chest pain, abdominal pain, problems with bowel movements, urination, Occ has some pain w/ intercourse. (deep penetration)  ? R/t pelvic congestion   Physical Exam: General:  Well developed, well nourished, no acute distress Skin:  Warm and dry Neck:  Midline trachea, normal thyroid Lungs; Clear to  auscultation bilaterally Breast:  No dominant palpable mass, retraction, or nipple discharge Cardiovascular: Regular rate and rhythm Abdomen:  Soft, non tender, no hepatosplenomegaly Pelvic:  External genitalia is normal in appearance.  The vagina is normal in appearance.  The cervix is bulbous.  Uterus is felt to be normal size, shape, and contour.  No adnexal masses or tenderness noted.  Extremities:  No swelling or varicosities noted Psych:  No mood changes.     Impression: normal GYN exam     Plan: if pap normal, get 2 more yearly.  If abnormal, proceed per protocol   Start Taytulla next pack

## 2018-01-03 NOTE — Addendum Note (Signed)
Addended by: Federico FlakeNES, PEGGY A on: 01/03/2018 04:21 PM   Modules accepted: Orders

## 2018-01-08 LAB — CYTOLOGY - PAP
Adequacy: ABSENT — AB
CHLAMYDIA, DNA PROBE: NEGATIVE
Neisseria Gonorrhea: NEGATIVE

## 2018-01-10 ENCOUNTER — Encounter: Payer: Self-pay | Admitting: Advanced Practice Midwife

## 2018-01-10 NOTE — Progress Notes (Signed)
Letter for colpo mailed. Had LSIL in 2016 (last pap)

## 2018-01-29 ENCOUNTER — Encounter: Payer: Medicaid Other | Admitting: Obstetrics & Gynecology

## 2018-02-09 ENCOUNTER — Encounter: Payer: Self-pay | Admitting: Obstetrics & Gynecology

## 2018-02-09 ENCOUNTER — Other Ambulatory Visit: Payer: Self-pay | Admitting: Obstetrics & Gynecology

## 2018-02-09 ENCOUNTER — Ambulatory Visit (INDEPENDENT_AMBULATORY_CARE_PROVIDER_SITE_OTHER): Payer: Self-pay | Admitting: Obstetrics & Gynecology

## 2018-02-09 VITALS — BP 120/80 | HR 94 | Ht 59.0 in | Wt 98.5 lb

## 2018-02-09 DIAGNOSIS — Z3202 Encounter for pregnancy test, result negative: Secondary | ICD-10-CM

## 2018-02-09 DIAGNOSIS — R87612 Low grade squamous intraepithelial lesion on cytologic smear of cervix (LGSIL): Secondary | ICD-10-CM

## 2018-02-09 LAB — POCT URINE PREGNANCY: PREG TEST UR: NEGATIVE

## 2018-02-09 NOTE — Progress Notes (Signed)
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  Prior cervical/vaginal disease: CIN 1. Prior cervical treatment: no treatment.  Smoker:  Yes.   New sexual partner:  No.  : time frame:  No.  History of abnormal Pap: yes  Procedure Details  The risks and benefits of the procedure and Written informed consent obtained.  Speculum placed in vagina and excellent visualization of cervix achieved, cervix swabbed x 3 with acetic acid solution.  Findings:adequate colpsocopy Cervix: dense AWE with punctation and mosaicism of the SCJ; SCJ visualized - lesion at 12 o'clock. Vaginal inspection: vaginal colposcopy normal Vulvar colposcopy: vulvar colposcopy not performed.  Specimens: cx bx  Complications: none.  Plan: Will send MyChart message regarding biospy results and treatment plan

## 2018-02-09 NOTE — Addendum Note (Signed)
Addended by: Tish Frederickson A on: 02/09/2018 01:16 PM   Modules accepted: Orders

## 2018-04-19 NOTE — Patient Instructions (Signed)
Your procedure is scheduled on: 05/02/2018  Report to [facility] at  6:30  AM.  Call this number if you have problems the morning of surgery: [phone]   Remember:   Do not drink or eat food:After Midnight.  :  Take these medicines the morning of surgery with A SIP OF WATER: none   Do not wear jewelry, make-up or nail polish.  Do not wear lotions, powders, or perfumes. You may wear deodorant.  Do not shave 48 hours prior to surgery. Men may shave face and neck.  Do not bring valuables to the hospital.  Contacts, dentures or bridgework may not be worn into surgery.  Leave suitcase in the car. After surgery it may be brought to your room.  For patients admitted to the hospital, checkout time is 11:00 AM the day of discharge.   Patients discharged the day of surgery will not be allowed to drive home.    Special Instructions: Shower using CHG night before surgery and shower the day of surgery use CHG.  Use special wash - you have one bottle of CHG for all showers.  You should use approximately 1/2 of the bottle for each shower.  Cervical Laser Surgery Cervical laser surgery is a procedure that uses a focused beam of light (laser) to shrink, destroy, or remove tissue from the opening of the uterus (cervix). You may have this surgery if:  You have abnormal cells (dysplasia) in your cervix that are an early sign of cancer.  Tissue from your cervix is to be tested for signs of disease.  Tell a health care provider about:  Any allergies you have.  All medicines you are taking, including vitamins, herbs, eye drops, creams, and over-the-counter medicines.  Any problems you or family members have had with anesthetic medicines.  Any blood disorders you have.  Any surgeries you have had.  Any medical conditions you have.  Whether you are pregnant or may be pregnant. What are the risks? Generally, this is a safe procedure. However, problems may occur,  including:  Bleeding.  Infection.  Allergic reactions to medicines or dyes.  Damage to other structures or organs.  Narrowing of the cervix (cervical stenosis). This can lead to infertility, cervical incompetency, and miscarriage or preterm labor in a future pregnancy.  What happens before the procedure?  Ask your health care provider about changing or stopping your regular medicines. This is especially important if you are taking diabetes medicines or blood thinners.  For at least 24 hours before the procedure: ? Do not have sex. ? Do not use tampons. ? Do not douche. ? Do not use vaginal creams or medicines. What happens during the procedure?  To lower your risk of infection, your health care team will wash or sanitize their hands.  You will lie on your back with your feet raised in footrests (stirrups).  A device called a speculum will be put into your vagina to hold it open.  You will be given a medicine to numb the area (local anesthetic). You may feel cramping when the medicine is injected.  A magnifying instrument called a colposcope will be placed into your vagina. It will shine a light and allow your health care provider to see your vagina and cervix.  A solution will be swabbed on your cervix to help your health care provider see the tissue.  A thin, flexible tube called an endoscope will be inserted into your vagina. The laser will be on the end  of the endoscope.  The laser will be used to shrink, destroy, or remove the tissue.  A cotton swab soaked in solution may be used to remove any burned or destroyed tissue.  A paste may be applied to your cervix to stop bleeding. The procedure may vary among health care providers and hospitals. What happens after the procedure?  You may feel some pain or cramping for a few days.  You may have some bleeding and brownish discharge.  Wear sanitary pads for discharge.  Do not have sex or put anything in your vagina  until your health care provider says it is okay.  Your health care provider may recommend that you limit your physical activity for a few days. Ask your health care provider what activities are safe for you. Summary  Cervical laser surgery is a procedure that uses a focused beam of light (laser) to shrink, destroy, or remove tissue from the cervix.  You may have this procedure if you have abnormal cells in your cervix that are an early sign of cancer, or if tissue from your cervix is to be tested for signs of disease.  Generally, this is a safe procedure. However, problems may occur.  Do not have sex, use tampons, douche, or use vaginal creams or medicines for at least 24 hours before the procedure.  You may have pain, cramping, bleeding, and discharge for a few days after the procedure. This information is not intended to replace advice given to you by your health care provider. Make sure you discuss any questions you have with your health care provider. Document Released: 08/15/2016 Document Revised: 08/15/2016 Document Reviewed: 08/15/2016 Elsevier Interactive Patient Education  2018 [employer]  Cervical Laser Surgery, Care After This sheet gives you information about how to care for yourself after your procedure. Your health care provider may also give you more specific instructions. If you have problems or questions, contact your health care provider. What can I expect after the procedure? After the procedure, it is common to have:  Pain or discomfort.  Mild cramping.  Bleeding, spotting, or brownish discharge from your vagina.  Follow these instructions at home: Activity  Return to your normal activities as told by your health care provider. Ask your health care provider what activities are safe for you.  Do not lift anything that is heavier than 10 lb (4.5 kg), or the limit that your health care provider tells you, until he or she says that it is safe.  Do not have sex  or put anything in your vagina until your health care provider says it is okay. General instructions  Take over-the-counter and prescription medicines only as told by your health care provider.  Do not drive or use heavy machinery while taking prescription pain medicine.  Wear sanitary pads to protect from bleeding, spotting, and discharge.  Do not use tampons or douche until your health care provider says it is okay.  It is up to you to get the results of your procedure. Ask your health care provider, or the department that is doing the procedure, when your results will be ready.  Keep all follow-up visits as told by your health care provider. This is important. Contact a health care provider if:  Your pain or cramping does not improve.  Your periods are more painful than usual.  You do not get your period as expected. Get help right away if:  You have any symptoms of infection, such as: ? A fever. ?  Chills. ? Discharge that smells bad.  You have severe pain in your abdomen.  You have heavy bleeding from your vagina (more than a normal period).  You have vaginal bleeding with clumps of blood (blood clots). Summary  After this procedure, it is common to have pain or discomfort and mild cramping. It is also common to have bleeding, spotting, or brownish discharge from your vagina.  You may need to wear sanitary pads to protect from bleeding, spotting, and discharge.  Do not have sex, use tampons, or douche until your health care provider says it is okay.  Return to your normal activities as told by your health care provider. Ask your health care provider what activities are safe for you.  Take over-the-counter and prescription medicines only as told by your health care provider. These include medicines for pain. This information is not intended to replace advice given to you by your health care provider. Make sure you discuss any questions you have with your health care  provider. Document Released: 08/15/2016 Document Revised: 08/15/2016 Document Reviewed: 08/15/2016 Elsevier Interactive Patient Education  2018 [employer]. General Anesthesia, Adult, Care After These instructions provide you with information about caring for yourself after your procedure. Your health care provider may also give you more specific instructions. Your treatment has been planned according to current medical practices, but problems sometimes occur. Call your health care provider if you have any problems or questions after your procedure. What can I expect after the procedure? After the procedure, it is common to have:  Vomiting.  A sore throat.  Mental slowness.  It is common to feel:  Nauseous.  Cold or shivery.  Sleepy.  Tired.  Sore or achy, even in parts of your body where you did not have surgery.  Follow these instructions at home: For at least 24 hours after the procedure:  Do not: ? Participate in activities where you could fall or become injured. ? Drive. ? Use heavy machinery. ? Drink alcohol. ? Take sleeping pills or medicines that cause drowsiness. ? Make important decisions or sign legal documents. ? Take care of children on your own.  Rest. Eating and drinking  If you vomit, drink water, juice, or soup when you can drink without vomiting.  Drink enough fluid to keep your urine clear or pale yellow.  Make sure you have little or no nausea before eating solid foods.  Follow the diet recommended by your health care provider. General instructions  Have a responsible adult stay with you until you are awake and alert.  Return to your normal activities as told by your health care provider. Ask your health care provider what activities are safe for you.  Take over-the-counter and prescription medicines only as told by your health care provider.  If you smoke, do not smoke without supervision.  Keep all follow-up visits as told by your  health care provider. This is important. Contact a health care provider if:  You continue to have nausea or vomiting at home, and medicines are not helpful.  You cannot drink fluids or start eating again.  You cannot urinate after 8-12 hours.  You develop a skin rash.  You have fever.  You have increasing redness at the site of your procedure. Get help right away if:  You have difficulty breathing.  You have chest pain.  You have unexpected bleeding.  You feel that you are having a life-threatening or urgent problem. This information is not intended to replace advice given  to you by your health care provider. Make sure you discuss any questions you have with your health care provider. Document Released: 01/02/2001 Document Revised: 02/29/2016 Document Reviewed: 09/10/2015 Elsevier Interactive Patient Education  [employer].

## 2018-04-19 NOTE — Patient Instructions (Signed)
   Your procedure is scheduled on: 05/02/2018  Report to [facility] at    6:30 AM.  Call this number if you have problems the morning of surgery: [phone]   Remember:   Do not drink or eat food:After Midnight.  :  Take these medicines the morning of surgery with A SIP OF WATER: none   Do not wear jewelry, make-up or nail polish.  Do not wear lotions, powders, or perfumes. You may wear deodorant.  Do not shave 48 hours prior to surgery. Men may shave face and neck.  Do not bring valuables to the hospital.  Contacts, dentures or bridgework may not be worn into surgery.  Leave suitcase in the car. After surgery it may be brought to your room.  For patients admitted to the hospital, checkout time is 11:00 AM the day of discharge.   Patients discharged the day of surgery will not be allowed to drive home.    Special Instructions: Shower using CHG night before surgery and shower the day of surgery use CHG.  Use special wash - you have one bottle of CHG for all showers.  You should use approximately 1/2 of the bottle for each shower.

## 2018-04-20 ENCOUNTER — Encounter (HOSPITAL_COMMUNITY): Payer: Self-pay

## 2018-04-20 ENCOUNTER — Encounter (HOSPITAL_COMMUNITY)
Admission: RE | Admit: 2018-04-20 | Discharge: 2018-04-20 | Disposition: A | Discharge status: Home / Self Care | Payer: Medicaid Other | Source: Ambulatory Visit | Attending: Obstetrics & Gynecology | Admitting: Obstetrics & Gynecology

## 2018-04-20 ENCOUNTER — Other Ambulatory Visit: Payer: Self-pay | Admitting: Obstetrics & Gynecology

## 2018-04-20 ENCOUNTER — Other Ambulatory Visit: Payer: Self-pay

## 2018-04-20 DIAGNOSIS — Z01812 Encounter for preprocedural laboratory examination: Secondary | ICD-10-CM | POA: Insufficient documentation

## 2018-04-20 LAB — COMPREHENSIVE METABOLIC PANEL
ALT: 28 U/L (ref 0–44)
ANION GAP: 9 (ref 5–15)
AST: 24 U/L (ref 15–41)
Albumin: 4.4 g/dL (ref 3.5–5.0)
Alkaline Phosphatase: 57 U/L (ref 38–126)
BUN: 11 mg/dL (ref 6–20)
CHLORIDE: 104 mmol/L (ref 98–111)
CO2: 27 mmol/L (ref 22–32)
Calcium: 9.6 mg/dL (ref 8.9–10.3)
Creatinine, Ser: 0.78 mg/dL (ref 0.44–1.00)
GFR calc Af Amer: >60 mL/min (ref >60)
Glucose, Bld: 98 mg/dL (ref 70–99)
Potassium: 3.7 mmol/L (ref 3.5–5.1)
Sodium: 140 mmol/L (ref 135–145)
TOTAL PROTEIN: 7.9 g/dL (ref 6.5–8.1)
Total Bilirubin: 0.6 mg/dL (ref 0.3–1.2)

## 2018-04-20 LAB — URINALYSIS, ROUTINE W REFLEX MICROSCOPIC
Bilirubin Urine: NEGATIVE
GLUCOSE, UA: NEGATIVE mg/dL
HGB URINE DIPSTICK: NEGATIVE
KETONES UR: NEGATIVE mg/dL
Leukocytes, UA: NEGATIVE
Nitrite: NEGATIVE
PROTEIN: NEGATIVE mg/dL
Specific Gravity, Urine: 1.015 (ref 1.005–1.030)
pH: 7 (ref 5.0–8.0)

## 2018-04-20 LAB — HCG, QUANTITATIVE, PREGNANCY

## 2018-04-20 LAB — CBC
HCT: 44.8 % (ref 36.0–46.0)
Hemoglobin: 15.6 g/dL — ABNORMAL HIGH (ref 12.0–15.0)
MCH: 30 pg (ref 26.0–34.0)
MCHC: 34.8 g/dL (ref 30.0–36.0)
MCV: 86.2 fL (ref 78.0–100.0)
Platelets: 345 10*3/uL (ref 150–400)
RBC: 5.2 MIL/uL — ABNORMAL HIGH (ref 3.87–5.11)
RDW: 13.4 % (ref 11.5–15.5)
WBC: 8.3 10*3/uL (ref 4.0–10.5)

## 2018-04-20 LAB — RAPID HIV SCREEN (HIV 1/2 AB+AG)
HIV 1/2 ANTIBODIES: NONREACTIVE
HIV-1 P24 Antigen - HIV24: NONREACTIVE

## 2018-05-02 ENCOUNTER — Encounter (HOSPITAL_COMMUNITY): Payer: Self-pay

## 2018-05-02 ENCOUNTER — Ambulatory Visit (HOSPITAL_COMMUNITY): Payer: Self-pay | Admitting: Anesthesiology

## 2018-05-02 ENCOUNTER — Encounter (HOSPITAL_COMMUNITY)
Admission: RE | Disposition: A | Discharge status: Home / Self Care | Payer: Self-pay | Source: Ambulatory Visit | Attending: Obstetrics & Gynecology

## 2018-05-02 ENCOUNTER — Ambulatory Visit (HOSPITAL_COMMUNITY)
Admission: RE | Admit: 2018-05-02 | Discharge: 2018-05-02 | Disposition: A | Discharge status: Home / Self Care | Payer: Self-pay | Source: Ambulatory Visit | Attending: Obstetrics & Gynecology | Admitting: Obstetrics & Gynecology

## 2018-05-02 DIAGNOSIS — R87613 High grade squamous intraepithelial lesion on cytologic smear of cervix (HGSIL): Secondary | ICD-10-CM

## 2018-05-02 HISTORY — PX: CERVICAL ABLATION: SHX5771

## 2018-05-02 SURGERY — ABLATION, CERVIX
Anesthesia: General

## 2018-05-02 MED ORDER — FENTANYL CITRATE (PF) 100 MCG/2ML IJ SOLN
INTRAMUSCULAR | Status: DC | PRN
  Administered 2018-05-02: 50 ug via INTRAVENOUS

## 2018-05-02 MED ORDER — WATER FOR IRRIGATION, STERILE IR SOLN
Status: DC | PRN
  Administered 2018-05-02: 1000 mL via SURGICAL_CAVITY

## 2018-05-02 MED ORDER — FERRIC SUBSULFATE 259 MG/GM EX SOLN
CUTANEOUS | Status: DC | PRN
  Administered 2018-05-02: 1

## 2018-05-02 MED ORDER — FERRIC SUBSULFATE 259 MG/GM EX SOLN
CUTANEOUS | Status: AC
  Filled 2018-05-02: qty 8

## 2018-05-02 MED ORDER — KETOROLAC TROMETHAMINE 30 MG/ML IJ SOLN: 30.0000 mg | Freq: Once | INTRAMUSCULAR | Status: DC | PRN

## 2018-05-02 MED ORDER — CEFAZOLIN SODIUM-DEXTROSE 2-4 GM/100ML-% IV SOLN
INTRAVENOUS | Status: AC
  Filled 2018-05-02: qty 100

## 2018-05-02 MED ORDER — MIDAZOLAM HCL 2 MG/2ML IJ SOLN
INTRAMUSCULAR | Status: AC
  Filled 2018-05-02: qty 2

## 2018-05-02 MED ORDER — FENTANYL CITRATE (PF) 100 MCG/2ML IJ SOLN
INTRAMUSCULAR | Status: AC
  Filled 2018-05-02: qty 2

## 2018-05-02 MED ORDER — KETOROLAC TROMETHAMINE 30 MG/ML IJ SOLN
30.0000 mg | Freq: Once | INTRAMUSCULAR | Status: AC
  Administered 2018-05-02: 30 mg via INTRAVENOUS

## 2018-05-02 MED ORDER — KETOROLAC TROMETHAMINE 10 MG PO TABS: 10.0000 mg | ORAL_TABLET | Freq: Three times a day (TID) | ORAL | 0 refills | Status: DC | PRN

## 2018-05-02 MED ORDER — MIDAZOLAM HCL 5 MG/5ML IJ SOLN
INTRAMUSCULAR | Status: DC | PRN
  Administered 2018-05-02: 2 mg via INTRAVENOUS

## 2018-05-02 MED ORDER — HYDROCODONE-ACETAMINOPHEN 7.5-325 MG PO TABS: 1.0000 | ORAL_TABLET | Freq: Once | ORAL | Status: DC | PRN

## 2018-05-02 MED ORDER — MEPERIDINE HCL 50 MG/ML IJ SOLN: 6.2500 mg | INTRAMUSCULAR | Status: DC | PRN

## 2018-05-02 MED ORDER — ACETIC ACID 5 % SOLN
Status: DC | PRN
  Administered 2018-05-02: 1 via TOPICAL

## 2018-05-02 MED ORDER — ONDANSETRON HCL 4 MG/2ML IJ SOLN
INTRAMUSCULAR | Status: DC | PRN
  Administered 2018-05-02: 4 mg via INTRAVENOUS

## 2018-05-02 MED ORDER — HYDROMORPHONE HCL 1 MG/ML IJ SOLN: 0.2500 mg | INTRAMUSCULAR | Status: DC | PRN

## 2018-05-02 MED ORDER — PROPOFOL 10 MG/ML IV BOLUS
INTRAVENOUS | Status: DC | PRN
  Administered 2018-05-02: 120 mg via INTRAVENOUS

## 2018-05-02 MED ORDER — HYDROCODONE-ACETAMINOPHEN 5-325 MG PO TABS: 1.0000 | ORAL_TABLET | Freq: Four times a day (QID) | ORAL | 0 refills | Status: DC | PRN

## 2018-05-02 MED ORDER — LACTATED RINGERS IV SOLN
INTRAVENOUS | Status: DC
  Administered 2018-05-02: 08:00:00 via INTRAVENOUS

## 2018-05-02 MED ORDER — ONDANSETRON 8 MG PO TBDP: 8.0000 mg | ORAL_TABLET | Freq: Three times a day (TID) | ORAL | 0 refills | Status: DC | PRN

## 2018-05-02 MED ORDER — SUCCINYLCHOLINE CHLORIDE 20 MG/ML IJ SOLN
INTRAMUSCULAR | Status: AC
Start: 2018-05-02 — End: ?
  Filled 2018-05-02: qty 1

## 2018-05-02 MED ORDER — ONDANSETRON HCL 4 MG/2ML IJ SOLN: 4.0000 mg | Freq: Once | INTRAMUSCULAR | Status: DC | PRN

## 2018-05-02 MED ORDER — CEFAZOLIN SODIUM-DEXTROSE 2-4 GM/100ML-% IV SOLN
2.0000 g | INTRAVENOUS | Status: AC
  Administered 2018-05-02: 2 g via INTRAVENOUS

## 2018-05-02 MED ORDER — KETOROLAC TROMETHAMINE 30 MG/ML IJ SOLN
INTRAMUSCULAR | Status: AC
  Filled 2018-05-02: qty 1

## 2018-05-02 MED ORDER — ONDANSETRON HCL 4 MG/2ML IJ SOLN
INTRAMUSCULAR | Status: AC
  Filled 2018-05-02: qty 8

## 2018-05-02 SURGICAL SUPPLY — 25 items
APPLICATOR COTTON TIP 6 STRL (MISCELLANEOUS) ×1 IMPLANT
APPLICATOR COTTON TIP 6IN STRL (MISCELLANEOUS) ×6 IMPLANT
CLOTH BEACON ORANGE TIMEOUT ST (SAFETY) ×3 IMPLANT
COVER LIGHT HANDLE STERIS (MISCELLANEOUS) ×6 IMPLANT
COVER MAYO STAND XLG (DRAPE) ×3 IMPLANT
COVER TABLE BACK 60X90 (DRAPES) ×3 IMPLANT
GAUZE SPONGE 4X4 16PLY XRAY LF (GAUZE/BANDAGES/DRESSINGS) ×3 IMPLANT
GLOVE BIOGEL PI IND STRL 7.0 (GLOVE) ×2 IMPLANT
GLOVE BIOGEL PI IND STRL 8 (GLOVE) ×1 IMPLANT
GLOVE BIOGEL PI INDICATOR 7.0 (GLOVE) ×4
GLOVE BIOGEL PI INDICATOR 8 (GLOVE) ×2
GLOVE ECLIPSE 8.0 STRL XLNG CF (GLOVE) ×3 IMPLANT
GOWN STRL REUS W/TWL LRG LVL3 (GOWN DISPOSABLE) ×3 IMPLANT
GOWN STRL REUS W/TWL XL LVL3 (GOWN DISPOSABLE) ×3 IMPLANT
KIT TURNOVER KIT A (KITS) ×3 IMPLANT
LASER FIBER DISP 1000U (UROLOGICAL SUPPLIES) ×3 IMPLANT
MANIFOLD NEPTUNE II (INSTRUMENTS) ×3 IMPLANT
MARKER SKIN DUAL TIP RULER LAB (MISCELLANEOUS) ×3 IMPLANT
PAD ARMBOARD 7.5X6 YLW CONV (MISCELLANEOUS) ×3 IMPLANT
PREFILTER SMOKE EVAC (FILTER) ×3 IMPLANT
SET BASIN LINEN APH (SET/KITS/TRAYS/PACK) ×3 IMPLANT
SHEET LAVH (DRAPES) ×3 IMPLANT
SWAB PROCTOSCOPIC (MISCELLANEOUS) ×3 IMPLANT
TUBING SMOKE EVAC CO2 (TUBING) ×3 IMPLANT
WATER STERILE IRR 1000ML POUR (IV SOLUTION) ×3 IMPLANT

## 2018-05-02 NOTE — H&P (Signed)
Preoperative History and Physical  Casey Maxwell is a 22 y.o. G1P1001 with Patient's last menstrual period was 04/02/2018. 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    PMH:   History reviewed. No pertinent past medical history.  PSH:     Past Surgical History:  Procedure Laterality Date  . EXTERNAL EAR SURGERY      POb/GynH:      G1P1001   SH:   Social History   Tobacco Use  . Smoking status: Never Smoker  . Smokeless tobacco: Never Used  Substance Use Topics  . Alcohol use: Yes    Comment: occ  . Drug use: No    FH:   History reviewed. No pertinent family history.   Allergies: No Known Allergies  Medications:       Current Facility-Administered Medications:  .  ceFAZolin (ANCEF) IVPB 2g/100 mL premix, 2 g, Intravenous, On Call to OR, Lazaro Arms, MD  Review of Systems:   Review of Systems  Constitutional: Negative for fever, chills, weight loss, malaise/fatigue and diaphoresis.  HENT: Negative for hearing loss, ear pain, nosebleeds, congestion, sore throat, neck pain, tinnitus and ear discharge.   Eyes: Negative for blurred vision, double vision, photophobia, pain, discharge and redness.  Respiratory: Negative for cough, hemoptysis, sputum production, shortness of breath, wheezing and stridor.   Cardiovascular: Negative for chest pain, palpitations, orthopnea, claudication, leg swelling and PND.  Gastrointestinal: Positive for abdominal pain. Negative for heartburn, nausea, vomiting, diarrhea, constipation, blood in stool and melena.  Genitourinary: Negative for dysuria, urgency, frequency, hematuria and flank pain.  Musculoskeletal: Negative for myalgias, back pain, joint pain and falls.  Skin: Negative for itching and rash.  Neurological: Negative for dizziness, tingling, tremors, sensory change, speech change, focal weakness, seizures, loss of consciousness, weakness and headaches.  Endo/Heme/Allergies: Negative for environmental allergies and  polydipsia. Does not bruise/bleed easily.  Psychiatric/Behavioral: Negative for depression, suicidal ideas, hallucinations, memory loss and substance abuse. The patient is not nervous/anxious and does not have insomnia.      PHYSICAL EXAM:  Blood pressure 121/79, pulse 79, temperature 97.9 F (36.6 C), temperature source Oral, resp. rate (!) 22, last menstrual period 04/02/2018, SpO2 100 %.    Vitals reviewed. Constitutional: She is oriented to person, place, and time. She appears well-developed and well-nourished.  HENT:  Head: Normocephalic and atraumatic.  Right Ear: External ear normal.  Left Ear: External ear normal.  Nose: Nose normal.  Mouth/Throat: Oropharynx is clear and moist.  Eyes: Conjunctivae and EOM are normal. Pupils are equal, round, and reactive to light. Right eye exhibits no discharge. Left eye exhibits no discharge. No scleral icterus.  Neck: Normal range of motion. Neck supple. No tracheal deviation present. No thyromegaly present.  Cardiovascular: Normal rate, regular rhythm, normal heart sounds and intact distal pulses.  Exam reveals no gallop and no friction rub.   No murmur heard. Respiratory: Effort normal and breath sounds normal. No respiratory distress. She has no wheezes. She has no rales. She exhibits no tenderness.  GI: Soft. Bowel sounds are normal. She exhibits no distension and no mass. There is tenderness. There is no rebound and no guarding.  Genitourinary:       Vulva is normal without lesions Vagina is pink moist without discharge Cervix  Colposcopy Procedure Note:  Colposcopy Procedure Note  Indications: Pap smear 1 months ago showed: low-grade squamous intraepithelial neoplasia (LGSIL - encompassing HPV,mild dysplasia,CIN I). The prior pap showed high-grade squamous intraepithelial neoplasia  (HGSIL-encompassing  moderate and severe dysplasia).  Prior cervical/vaginal disease: CIN 1. Prior cervical treatment: no treatment.  Smoker:  Yes.    New sexual partner:  No.  : time frame:  No.  History of abnormal Pap: yes  Procedure Details  The risks and benefits of the procedure and Written informed consent obtained.  Speculum placed in vagina and excellent visualization of cervix achieved, cervix swabbed x 3 with acetic acid solution.  Findings:adequate colpsocopy Cervix: dense AWE with punctation and mosaicism of the SCJ; SCJ visualized - lesion at 12 o'clock. Vaginal inspection: vaginal colposcopy normal Vulvar colposcopy: vulvar colposcopy not performed.  Specimens: cx bx  Complications: none.  Plan: Will send MyChart message regarding biospy results and treatment plan     Uterus is normal size, contour, position, consistency, mobility, non-tender Adnexa is negative with normal sized ovaries by sonogram  Musculoskeletal: Normal range of motion. She exhibits no edema and no tenderness.  Neurological: She is alert and oriented to person, place, and time. She has normal reflexes. She displays normal reflexes. No cranial nerve deficit. She exhibits normal muscle tone. Coordination normal.  Skin: Skin is warm and dry. No rash noted. No erythema. No pallor.  Psychiatric: She has a normal mood and affect. Her behavior is normal. Judgment and thought content normal.    Labs: Results for orders placed or performed during the hospital encounter of 04/20/18 (from the past 336 hour(s))  CBC   Collection Time: 04/20/18  1:52 PM  Result Value Ref Range   WBC 8.3 4.0 - 10.5 K/uL   RBC 5.20 (H) 3.87 - 5.11 MIL/uL   Hemoglobin 15.6 (H) 12.0 - 15.0 g/dL   HCT 16.1 09.6 - 04.5 %   MCV 86.2 78.0 - 100.0 fL   MCH 30.0 26.0 - 34.0 pg   MCHC 34.8 30.0 - 36.0 g/dL   RDW 40.9 81.1 - 91.4 %   Platelets 345 150 - 400 K/uL  Comprehensive metabolic panel   Collection Time: 04/20/18  1:52 PM  Result Value Ref Range   Sodium 140 135 - 145 mmol/L   Potassium 3.7 3.5 - 5.1 mmol/L   Chloride 104 98 - 111 mmol/L   CO2 27 22 -  32 mmol/L   Glucose, Bld 98 70 - 99 mg/dL   BUN 11 6 - 20 mg/dL   Creatinine, Ser 7.82 0.44 - 1.00 mg/dL   Calcium 9.6 8.9 - 95.6 mg/dL   Total Protein 7.9 6.5 - 8.1 g/dL   Albumin 4.4 3.5 - 5.0 g/dL   AST 24 15 - 41 U/L   ALT 28 0 - 44 U/L   Alkaline Phosphatase 57 38 - 126 U/L   Total Bilirubin 0.6 0.3 - 1.2 mg/dL   GFR calc non Af Amer >60 >60 mL/min   GFR calc Af Amer >60 >60 mL/min   Anion gap 9 5 - 15  hCG, quantitative, pregnancy   Collection Time: 04/20/18  1:52 PM  Result Value Ref Range   hCG, Beta Chain, Quant, S <1 <5 mIU/mL  Rapid HIV screen (HIV 1/2 Ab+Ag)   Collection Time: 04/20/18  1:52 PM  Result Value Ref Range   HIV-1 P24 Antigen - HIV24 NON REACTIVE NON REACTIVE   HIV 1/2 Antibodies NON REACTIVE NON REACTIVE   Interpretation (HIV Ag Ab)      A non reactive test result means that HIV 1 or HIV 2 antibodies and HIV 1 p24 antigen were not detected in the specimen.  Urinalysis, Routine w  reflex microscopic   Collection Time: 04/20/18  1:52 PM  Result Value Ref Range   Color, Urine YELLOW YELLOW   APPearance CLEAR CLEAR   Specific Gravity, Urine 1.015 1.005 - 1.030   pH 7.0 5.0 - 8.0   Glucose, UA NEGATIVE NEGATIVE mg/dL   Hgb urine dipstick NEGATIVE NEGATIVE   Bilirubin Urine NEGATIVE NEGATIVE   Ketones, ur NEGATIVE NEGATIVE mg/dL   Protein, ur NEGATIVE NEGATIVE mg/dL   Nitrite NEGATIVE NEGATIVE   Leukocytes, UA NEGATIVE NEGATIVE    EKG: No orders found for this or any previous visit.  Imaging Studies: No results found.    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

## 2018-05-02 NOTE — Discharge Instructions (Signed)
Cervical Laser Surgery, Care After °This sheet gives you information about how to care for yourself after your procedure. Your health care provider may also give you more specific instructions. If you have problems or questions, contact your health care provider. °What can I expect after the procedure? °After the procedure, it is common to have: °· Pain or discomfort. °· Mild cramping. °· Bleeding, spotting, or brownish discharge from your vagina. ° °Follow these instructions at home: °Activity °· Return to your normal activities as told by your health care provider. Ask your health care provider what activities are safe for you. °· Do not lift anything that is heavier than 10 lb (4.5 kg), or the limit that your health care provider tells you, until he or she says that it is safe. °· Do not have sex or put anything in your vagina until your health care provider says it is okay. °General instructions °· Take over-the-counter and prescription medicines only as told by your health care provider. °· Do not drive or use heavy machinery while taking prescription pain medicine. °· Wear sanitary pads to protect from bleeding, spotting, and discharge. °· Do not use tampons or douche until your health care provider says it is okay. °· It is up to you to get the results of your procedure. Ask your health care provider, or the department that is doing the procedure, when your results will be ready. °· Keep all follow-up visits as told by your health care provider. This is important. °Contact a health care provider if: °· Your pain or cramping does not improve. °· Your periods are more painful than usual. °· You do not get your period as expected. °Get help right away if: °· You have any symptoms of infection, such as: °? A fever. °? Chills. °? Discharge that smells bad. °· You have severe pain in your abdomen. °· You have heavy bleeding from your vagina (more than a normal period). °· You have vaginal bleeding with clumps of  blood (blood clots). °Summary °· After this procedure, it is common to have pain or discomfort and mild cramping. It is also common to have bleeding, spotting, or brownish discharge from your vagina. °· You may need to wear sanitary pads to protect from bleeding, spotting, and discharge. °· Do not have sex, use tampons, or douche until your health care provider says it is okay. °· Return to your normal activities as told by your health care provider. Ask your health care provider what activities are safe for you. °· Take over-the-counter and prescription medicines only as told by your health care provider. These include medicines for pain. °This information is not intended to replace advice given to you by your health care provider. Make sure you discuss any questions you have with your health care provider. °Document Released: 08/15/2016 Document Revised: 08/15/2016 Document Reviewed: 08/15/2016 °Elsevier Interactive Patient Education © 2018 [employer] ° °

## 2018-05-02 NOTE — Transfer of Care (Signed)
Immediate Anesthesia Transfer of Care Note  Patient: Casey Maxwell  Procedure(s) Performed: Laser Ablation of Cervix (N/A )  Patient Location: PACU  Anesthesia Type:General  Level of Consciousness: awake and patient cooperative  Airway & Oxygen Therapy: Patient Spontanous Breathing and Patient connected to nasal cannula oxygen  Post-op Assessment: Report given to RN, Post -op Vital signs reviewed and stable and Patient moving all extremities  Post vital signs: Reviewed and stable  Last Vitals:  Vitals Value Taken Time  BP    Temp    Pulse 92 05/02/2018  9:22 AM  Resp    SpO2 96 % 05/02/2018  9:22 AM  Vitals shown include unvalidated device data.  Last Pain:  Vitals:   05/02/18 0722  TempSrc: Oral  PainSc: 0-No pain         Complications: No apparent anesthesia complications

## 2018-05-02 NOTE — Op Note (Signed)
Preoperative Diagnosis:  High Grade Squamous Intraepithelial lesion, adequate colposcopy  Postoperative Diagnosis:  Same as above  Procedure:  Laser ablation of the cervix  Surgeon:  Lazaro ArmsLuther H Noretta Frier MD  Anaesthesia:  Laryngeal Mask Airway  Findings:  Patient had an abnormal pap smear which was evaluated in the office with colposcopy and directed biopsies.  Pathology report returned as high Grade SIL.  The colposcopy was adequate.  As a result, the patient is admitted for laser ablation of the cervix.  Description of Note:  Patient was taken to the OR and placed in the supine position where she underwent laryngeal mask airway anaesthesia.  She was placed in the dorsal lithotomy position.  She was draped for laser.  Graves speculum was placed and 3% acetic acid used and the laser microscope employed to perform colposcopy which confirmed the office findings.  Laser was used on typical cervical settings and used to vaporized the squamocolumnar junction to  depth of  5-7 mm peripherally and 7-9 mm centrally.  Surgical margin of several mm was employed beyond the acetowhite epithelium.  Hemostasis was achieved with the laser and Monsel's solution.  Patient was awakened from anaesthesia in good stable condition and all counts were correct.  She received Ancef 2 gram and Toradol 30 mg IV preoperatively prophylactically.  Lazaro ArmsLuther H Antoine Fiallos, MD  05/02/2018 9:16 AM

## 2018-05-02 NOTE — Anesthesia Postprocedure Evaluation (Signed)
Anesthesia Post Note  Patient: Casey Maxwell  Procedure(s) Performed: Laser Ablation of Cervix (N/A )  Patient location during evaluation: PACU Anesthesia Type: General Level of consciousness: awake and alert and patient cooperative Pain management: pain level controlled Vital Signs Assessment: post-procedure vital signs reviewed and stable Respiratory status: spontaneous breathing, nonlabored ventilation and respiratory function stable Postop Assessment: no apparent nausea or vomiting Anesthetic complications: no     Last Vitals:  Vitals:   05/02/18 0921 05/02/18 0930  BP: 107/68 104/67  Pulse: 92 84  Resp: 15 18  Temp: 36.8 C   SpO2: 96% 100%    Last Pain:  Vitals:   05/02/18 0930  TempSrc:   PainSc: 0-No pain                 Tashai Catino J

## 2018-05-02 NOTE — Anesthesia Procedure Notes (Signed)
Procedure Name: LMA Insertion Date/Time: 05/02/2018 8:46 AM Performed by: Despina HiddenIdacavage, Shelaine Frie J, CRNA Pre-anesthesia Checklist: Patient identified, Patient being monitored, Emergency Drugs available, Timeout performed and Suction available Patient Re-evaluated:Patient Re-evaluated prior to induction Oxygen Delivery Method: Circle System Utilized Preoxygenation: Pre-oxygenation with 100% oxygen Induction Type: IV induction Ventilation: Mask ventilation without difficulty LMA: LMA inserted LMA Size: 4.0 Number of attempts: 1 Placement Confirmation: positive ETCO2 and breath sounds checked- equal and bilateral Tube secured with: Tape Dental Injury: Teeth and Oropharynx as per pre-operative assessment

## 2018-05-02 NOTE — Anesthesia Preprocedure Evaluation (Signed)
Anesthesia Evaluation  Patient identified by MRN, date of birth, ID band Patient awake    Reviewed: Allergy & Precautions, H&P , NPO status , Patient's Chart, lab work & pertinent test results  Airway Mallampati: I  TM Distance: >3 FB Neck ROM: full    Dental no notable dental hx.    Pulmonary neg pulmonary ROS,    Pulmonary exam normal breath sounds clear to auscultation       Cardiovascular Exercise Tolerance: Good negative cardio ROS   Rhythm:regular Rate:Normal     Neuro/Psych negative neurological ROS  negative psych ROS   GI/Hepatic negative GI ROS, Neg liver ROS,   Endo/Other  negative endocrine ROS  Renal/GU negative Renal ROS  negative genitourinary   Musculoskeletal   Abdominal   Peds  Hematology negative hematology ROS (+)   Anesthesia Other Findings   Reproductive/Obstetrics negative OB ROS                             Anesthesia Physical Anesthesia Plan  ASA: II  Anesthesia Plan: General   Post-op Pain Management:    Induction:   PONV Risk Score and Plan:   Airway Management Planned:   Additional Equipment:   Intra-op Plan:   Post-operative Plan:   Informed Consent: I have reviewed the patients History and Physical, chart, labs and discussed the procedure including the risks, benefits and alternatives for the proposed anesthesia with the patient or authorized representative who has indicated his/her understanding and acceptance.   Dental Advisory Given  Plan Discussed with: CRNA  Anesthesia Plan Comments:         Anesthesia Quick Evaluation  

## 2018-05-03 ENCOUNTER — Encounter (HOSPITAL_COMMUNITY): Payer: Self-pay | Admitting: Obstetrics & Gynecology

## 2018-05-07 ENCOUNTER — Telehealth: Payer: Self-pay | Admitting: *Deleted

## 2018-05-07 ENCOUNTER — Encounter (HOSPITAL_COMMUNITY): Payer: Self-pay | Admitting: Emergency Medicine

## 2018-05-07 ENCOUNTER — Ambulatory Visit (HOSPITAL_COMMUNITY)
Admission: EM | Admit: 2018-05-07 | Discharge: 2018-05-07 | Disposition: A | Discharge status: Home / Self Care | Payer: Medicaid Other | Attending: Family Medicine | Admitting: Family Medicine

## 2018-05-07 DIAGNOSIS — R1030 Lower abdominal pain, unspecified: Secondary | ICD-10-CM

## 2018-05-07 DIAGNOSIS — Z3202 Encounter for pregnancy test, result negative: Secondary | ICD-10-CM

## 2018-05-07 LAB — POCT URINALYSIS DIP (DEVICE)
BILIRUBIN URINE: NEGATIVE
GLUCOSE, UA: NEGATIVE mg/dL
Hgb urine dipstick: NEGATIVE
KETONES UR: NEGATIVE mg/dL
Leukocytes, UA: NEGATIVE
NITRITE: NEGATIVE
PROTEIN: NEGATIVE mg/dL
Specific Gravity, Urine: 1.025 (ref 1.005–1.030)
Urobilinogen, UA: 0.2 mg/dL (ref 0.0–1.0)
pH: 6 (ref 5.0–8.0)

## 2018-05-07 LAB — POCT PREGNANCY, URINE: PREG TEST UR: NEGATIVE

## 2018-05-07 MED ORDER — DOCUSATE SODIUM 50 MG PO CAPS: 50.0000 mg | ORAL_CAPSULE | Freq: Two times a day (BID) | ORAL | 0 refills | Status: DC

## 2018-05-07 MED ORDER — KETOROLAC TROMETHAMINE 30 MG/ML IJ SOLN: 30.0000 mg | Freq: Once | INTRAMUSCULAR | Status: DC

## 2018-05-07 MED ORDER — KETOROLAC TROMETHAMINE 30 MG/ML IJ SOLN
INTRAMUSCULAR | Status: AC
  Filled 2018-05-07: qty 1

## 2018-05-07 MED ORDER — POLYETHYLENE GLYCOL 3350 17 G PO PACK: 17.0000 g | PACK | Freq: Every day | ORAL | 0 refills | Status: DC

## 2018-05-07 NOTE — Telephone Encounter (Signed)
Patient states she is experiencing constant abdominal pain.  She is taking the pain medication but is not really helping. Her bleeding is light and dark brown.  Advised to try using a heating pad on her abdomen for the pain.  States she is supposed to return to work on Wednesday but is unsure if she will be able to work. Advised to call back tomorrow or Wednesday morning if she needs a note. Verbalized understanding.

## 2018-05-07 NOTE — Discharge Instructions (Signed)
As discussed, abdominal pain could be due to constipation or recent procedure.  Ketorolac injection in office today.  Please resume ketorolac at home as directed, and take Norco as needed.  Start MiraLAX as directed.  If continues to have hard stools, you can start Colace, this is slightly stronger, and may cause some abdominal cramping.  Follow-up with GYN as scheduled for reevaluation.  If experiencing worsening symptoms, worsening abdominal pain, nausea/vomiting despite medication use, unwilling to jump up and down due to abdominal pain, fever, go to the emergency department for further evaluation.
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
## 2018-05-07 NOTE — ED Triage Notes (Signed)
Pt sts lower abd pain; pt sts had laser procedure on cervix last week

## 2018-05-09 ENCOUNTER — Telehealth: Payer: Self-pay | Admitting: *Deleted

## 2018-05-09 ENCOUNTER — Encounter: Payer: Self-pay | Admitting: *Deleted

## 2018-05-09 NOTE — Telephone Encounter (Signed)
VM not set up.

## 2018-05-10 ENCOUNTER — Encounter: Payer: Self-pay | Admitting: Obstetrics & Gynecology

## 2018-05-10 ENCOUNTER — Ambulatory Visit (INDEPENDENT_AMBULATORY_CARE_PROVIDER_SITE_OTHER): Payer: Self-pay | Admitting: Obstetrics & Gynecology

## 2018-05-10 VITALS — BP 112/65 | HR 94 | Ht 59.0 in | Wt 94.0 lb

## 2018-05-10 DIAGNOSIS — Z9889 Other specified postprocedural states: Secondary | ICD-10-CM

## 2018-05-10 NOTE — Progress Notes (Signed)
  HPI: Patient returns for routine postoperative follow-up having undergone laser ablation of the cervix on 05/02/2018.  The patient's immediate postoperative recovery has been unremarkable. Since hospital discharge the patient reports excessive lower abdominal pain, prompted a visit to an urgent care, no UTI symptoms, no constipation no significant vaginal bleeding  I informed patient her level of post op pain from a laser ablation is dramaticlly disproportionate but I am unable to find any other source for her pain .  UA at urgent cre was negative   Current Outpatient Medications: HYDROcodone-acetaminophen (NORCO/VICODIN) 5-325 MG tablet, Take 1 tablet by mouth every 6 (six) hours as needed., Disp: 15 tablet, Rfl: 0 ketorolac (TORADOL) 10 MG tablet, Take 1 tablet (10 mg total) by mouth every 8 (eight) hours as needed., Disp: 15 tablet, Rfl: 0 Norethin Ace-Eth Estrad-FE (TAYTULLA) 1-20 MG-MCG(24) CAPS, Take 1 tablet by mouth daily., Disp: 28 capsule, Rfl: 11 docusate sodium (COLACE) 50 MG capsule, Take 1 capsule (50 mg total) by mouth 2 (two) times daily. (Patient not taking: Reported on 05/10/2018), Disp: 10 capsule, Rfl: 0 ondansetron (ZOFRAN ODT) 8 MG disintegrating tablet, Take 1 tablet (8 mg total) by mouth every 8 (eight) hours as needed for nausea or vomiting. (Patient not taking: Reported on 05/10/2018), Disp: 20 tablet, Rfl: 0 polyethylene glycol (MIRALAX) packet, Take 17 g by mouth daily. (Patient not taking: Reported on 05/10/2018), Disp: 14 each, Rfl: 0  No current facility-administered medications for this visit.     Blood pressure 112/65, pulse 94, height 4\' 11"  (1.499 m), weight 94 lb (42.6 kg).  Physical Exam: Cervical cone bed is healing well no blood in vault Abdomen is benign  Diagnostic Tests:   Pathology: HSIL   Impression: S/p laser ablation for HSIL with disproportionate pain in her post op course  Plan: No intercourse for 4 weeks, hopefully her pain will resolve  as she heals, very unusual after this surgery  Follow up: Return in about 6 months (around 11/10/2018) for Follow up, with Dr Despina HiddenEure.   Lazaro ArmsLuther H Zalen Sequeira, MD

## 2018-05-30 ENCOUNTER — Encounter: Payer: Self-pay | Admitting: Obstetrics & Gynecology

## 2019-01-02 ENCOUNTER — Telehealth: Payer: Self-pay | Admitting: Obstetrics & Gynecology

## 2019-01-02 NOTE — Telephone Encounter (Signed)
The patient stated she took an at home pregnancy test- positve. Informed the patient of the virtual appointment over the phone and also of our COV19 restrictions. Sending the patient a reminder letter.

## 2019-02-12 ENCOUNTER — Other Ambulatory Visit: Payer: Self-pay

## 2019-02-12 ENCOUNTER — Ambulatory Visit (INDEPENDENT_AMBULATORY_CARE_PROVIDER_SITE_OTHER): Payer: Self-pay | Admitting: *Deleted

## 2019-02-12 DIAGNOSIS — Z3490 Encounter for supervision of normal pregnancy, unspecified, unspecified trimester: Secondary | ICD-10-CM

## 2019-02-12 NOTE — Progress Notes (Signed)
I connected with  Casey Maxwell on 02/12/19 at  8:15 AM EDT by telephone and verified that I am speaking with the correct person using two identifiers.   I introduced myself and stated that I am calling to complete her intake interview to obtain her medical information in order to begin prenatal care. Pt stated that she is getting ready for work and had forgotten about the appointment. She did not see the message on MyChart. She stated she does not have 30-40 minutes right now for this appointment. Our call became disconnected and when I called back 4 different times over the next 12 minutes, pt did not answer and her voice mail was full. I was not able to leave a message. Pt will be contacted by our scheduling staff to reschedule the appointment.      Day, Drucilla Schmidt, RN 02/12/2019  8:27 AM

## 2019-03-21 ENCOUNTER — Telehealth: Payer: Self-pay | Admitting: Obstetrics & Gynecology

## 2019-03-21 NOTE — Telephone Encounter (Signed)
Attempted to call patient to inform her of an appointment change. I was not able to leave a VM because it was full.

## 2019-03-26 ENCOUNTER — Telehealth: Payer: Self-pay | Admitting: Family Medicine

## 2019-03-26 NOTE — Telephone Encounter (Signed)
Attempted to call patient about her appointment on 6/17 @ 1:15. No answer, and could not leave a voicemail because it was full.

## 2019-03-27 ENCOUNTER — Other Ambulatory Visit: Payer: Self-pay

## 2019-03-27 ENCOUNTER — Ambulatory Visit: Payer: Medicaid Other | Admitting: *Deleted

## 2019-03-27 DIAGNOSIS — Z3490 Encounter for supervision of normal pregnancy, unspecified, unspecified trimester: Secondary | ICD-10-CM

## 2019-03-27 NOTE — Progress Notes (Signed)
1:15 I called Casey Maxwell at her mobile/home number and heard message the person you called is not available and the mailbox is full and cannot accept messages. Unable to leave a message.  Thornton Dohrmann,RN 1:23 I called Casey Maxwell at her mobile/home number and heard message the person you called is not available and the mailbox is full and cannot accept messages. Unable to leave a message.  I also called her support person Casey Maxwell and heard a message this person can not be reached at this time; please try your call again later. Wm Sahagun,RN 1:27pm I called Casey Maxwell at her mobile/home number and heard message the person you called is not available and the mailbox is full and cannot accept messages. Unable to leave a message. Of note I can see in Mychart she is apparently going to [facility] for prenatal care. Will message registrar to follow up with Casey Maxwell and reschedule if patient desires prenatal care with [place].  Kelli Egolf,RN

## 2019-04-03 ENCOUNTER — Encounter: Payer: Medicaid Other | Admitting: Obstetrics and Gynecology

## 2021-03-09 ENCOUNTER — Encounter (HOSPITAL_COMMUNITY): Payer: Self-pay | Admitting: Emergency Medicine

## 2021-03-09 ENCOUNTER — Ambulatory Visit (HOSPITAL_COMMUNITY)
Admission: EM | Admit: 2021-03-09 | Discharge: 2021-03-09 | Disposition: A | Discharge status: Home / Self Care | Payer: Medicaid Other | Attending: Medical Oncology | Admitting: Medical Oncology

## 2021-03-09 DIAGNOSIS — H6593 Unspecified nonsuppurative otitis media, bilateral: Secondary | ICD-10-CM

## 2021-03-09 DIAGNOSIS — H6122 Impacted cerumen, left ear: Secondary | ICD-10-CM | POA: Diagnosis not present

## 2021-03-09 DIAGNOSIS — H9203 Otalgia, bilateral: Secondary | ICD-10-CM | POA: Diagnosis not present

## 2021-03-09 MED ORDER — FLUTICASONE PROPIONATE 50 MCG/ACT NA SUSP: 2.0000 | Freq: Every day | NASAL | 0 refills | Status: DC
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
## 2021-03-09 NOTE — ED Triage Notes (Signed)
Pt presents today with c/o of bilateral ear pain. She reports that pain began after she went swimming yesterday. She inserted Peroxide in ears with no relief.

## 2021-04-15 ENCOUNTER — Ambulatory Visit (INDEPENDENT_AMBULATORY_CARE_PROVIDER_SITE_OTHER): Payer: Medicaid Other | Admitting: Adult Health

## 2021-04-15 ENCOUNTER — Encounter: Payer: Self-pay | Admitting: Adult Health

## 2021-04-15 ENCOUNTER — Other Ambulatory Visit: Payer: Self-pay

## 2021-04-15 VITALS — BP 120/72 | HR 96 | Ht 59.0 in | Wt 117.0 lb

## 2021-04-15 DIAGNOSIS — N926 Irregular menstruation, unspecified: Secondary | ICD-10-CM

## 2021-04-15 DIAGNOSIS — Z3046 Encounter for surveillance of implantable subdermal contraceptive: Secondary | ICD-10-CM | POA: Diagnosis not present

## 2021-04-15 DIAGNOSIS — Z978 Presence of other specified devices: Secondary | ICD-10-CM | POA: Diagnosis not present

## 2021-04-15 DIAGNOSIS — Z975 Presence of (intrauterine) contraceptive device: Secondary | ICD-10-CM

## 2021-04-15 MED ORDER — MEGESTROL ACETATE 40 MG PO TABS: ORAL_TABLET | ORAL | 1 refills | Status: DC

## 2021-04-15 NOTE — Progress Notes (Signed)
  Subjective:     Patient ID: Casey Maxwell, female   DOB: [birth year], 25 y.o.   MRN: [id number]  HPI Alanah is a 25 year old white female,married, G3P2012 in as new pt, complaining of bleeding with nexplanon, and was placed on OCs, still bleeding. Had pap 07/14/20 in [place] ASCUS negative HPV.   Review of Systems Irregular bleeding with nexplanon Patient denies any headaches, hearing loss, fatigue, blurred vision, shortness of breath, chest pain, abdominal pain, problems with bowel movements, urination, or intercourse. No joint pain or mood swings.  Reviewed past medical,surgical, social and family history. Reviewed medications and allergies.     Objective:   Physical Exam BP 120/72 (BP Location: Left Arm, Patient Position: Sitting, Cuff Size: Normal)   Pulse 96   Ht 4\' 11"  (1.499 m)   Wt 117 lb (53.1 kg)   Breastfeeding No   BMI 23.63 kg/m  Skin warm and dry. Neck: mid line trachea, normal thyroid, good ROM, no lymphadenopathy noted. Lungs: clear to ausculation bilaterally. Cardiovascular: regular rate and rhythm. Nexplanon palpated left arm.   AA is 1 Fall risk is low Depression screen PHQ 2/9 04/15/2021  Decreased Interest 0  Down, Depressed, Hopeless 0  PHQ - 2 Score 0  Altered sleeping 0  Tired, decreased energy 0  Change in appetite 0  Feeling bad or failure about yourself  0  Trouble concentrating 0  Moving slowly or fidgety/restless 0  Suicidal thoughts 0  PHQ-9 Score 0    GAD 7 : Generalized Anxiety Score 04/15/2021  Nervous, Anxious, on Edge 0  Control/stop worrying 0  Worry too much - different things 0  Trouble relaxing 0  Restless 0  Easily annoyed or irritable 0  Afraid - awful might happen 0  Total GAD 7 Score 0      Upstream - 04/15/21 1002       Pregnancy Intention Screening   Does the patient want to become pregnant in the next year? Ok Either Way    Does the patient's partner want to become pregnant in the next year? Ok Either Way    Would  the patient like to discuss contraceptive options today? No      Contraception Wrap Up   Current Method Hormonal Implant    End Method Hormonal Implant             Assessment:     1. Irregular bleeding Stop OCs, will rx megace for use when bleeding Meds ordered this encounter  Medications   megestrol (MEGACE) 40 MG tablet    Sig: Take 2 daily for bleeding    Dispense:  60 tablet    Refill:  1    Order Specific Question:   Supervising Provider    Answer:   06/16/21, LUTHER H [[id number]]     2. Nexplanon in place     Plan:     Follow up in 8 weeks or sooner if needed    Request records from [facility] in [place]

## 2021-04-16 ENCOUNTER — Ambulatory Visit
Admission: EM | Admit: 2021-04-16 | Discharge: 2021-04-16 | Disposition: A | Discharge status: Home / Self Care | Payer: Medicaid Other | Attending: Emergency Medicine | Admitting: Emergency Medicine

## 2021-04-16 ENCOUNTER — Encounter: Payer: Self-pay | Admitting: Emergency Medicine

## 2021-04-16 ENCOUNTER — Other Ambulatory Visit: Payer: Self-pay

## 2021-04-16 DIAGNOSIS — M542 Cervicalgia: Secondary | ICD-10-CM

## 2021-04-16 DIAGNOSIS — M549 Dorsalgia, unspecified: Secondary | ICD-10-CM

## 2021-04-16 MED ORDER — PREDNISONE 20 MG PO TABS: 20.0000 mg | ORAL_TABLET | Freq: Two times a day (BID) | ORAL | 0 refills | Status: AC

## 2021-04-16 MED ORDER — CYCLOBENZAPRINE HCL 10 MG PO TABS: 10.0000 mg | ORAL_TABLET | Freq: Two times a day (BID) | ORAL | 0 refills | Status: DC | PRN
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
## 2021-04-16 NOTE — Discharge Instructions (Addendum)
We will hold off on x-rays today since there has been no new injury or fall Continue conservative management of rest, ice, and gentle stretches Prednisone prescribed.  Take as directed and to completion Take cyclobenzaprine at nighttime for symptomatic relief. Avoid driving or operating heavy machinery while using medication. Follow up with PCP if symptoms persist Return or go to the ER if you have any new or worsening symptoms (fever, chills, chest pain, redness, swelling, numbness/ tingling, etc...)

## 2021-04-19 ENCOUNTER — Ambulatory Visit (INDEPENDENT_AMBULATORY_CARE_PROVIDER_SITE_OTHER): Payer: Medicaid Other

## 2021-04-19 ENCOUNTER — Other Ambulatory Visit: Payer: Self-pay

## 2021-04-19 ENCOUNTER — Encounter: Payer: Self-pay | Admitting: Orthopaedic Surgery

## 2021-04-19 ENCOUNTER — Ambulatory Visit (INDEPENDENT_AMBULATORY_CARE_PROVIDER_SITE_OTHER): Payer: Medicaid Other | Admitting: Orthopaedic Surgery

## 2021-04-19 VITALS — BP 132/83 | HR 94 | Ht 59.0 in | Wt 118.0 lb

## 2021-04-19 DIAGNOSIS — M542 Cervicalgia: Secondary | ICD-10-CM
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
## 2021-04-21 ENCOUNTER — Telehealth: Payer: Self-pay | Admitting: Orthopaedic Surgery

## 2021-04-21 ENCOUNTER — Ambulatory Visit: Payer: Medicaid Other | Admitting: Family Medicine

## 2021-04-21 NOTE — Telephone Encounter (Signed)
Pt calling just had a question for the nurse and wanted a call back when possible. The best call back number is [phone].
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
## 2021-05-06 ENCOUNTER — Other Ambulatory Visit: Payer: Self-pay

## 2021-05-06 ENCOUNTER — Ambulatory Visit (HOSPITAL_COMMUNITY): Payer: Medicaid Other | Attending: Orthopaedic Surgery | Admitting: Physical Therapy

## 2021-05-06 ENCOUNTER — Encounter (HOSPITAL_COMMUNITY): Payer: Self-pay | Admitting: Physical Therapy

## 2021-05-06 DIAGNOSIS — M542 Cervicalgia: Secondary | ICD-10-CM | POA: Insufficient documentation

## 2021-05-06 DIAGNOSIS — R293 Abnormal posture: Secondary | ICD-10-CM | POA: Insufficient documentation

## 2021-05-06 NOTE — Patient Instructions (Signed)
Access Code: [email or web address] URL: [email or web address] Date: 05/06/2021 Prepared by: Georges Lynch  Exercises Seated Scapular Retraction - 2-3 x daily - 7 x weekly - 2 sets - 10 reps - 5 second hold Seated Scapular Retraction with External Rotation - 2-3 x daily - 7 x weekly - 2 sets - 10 reps - 5 second hold Seated Cervical Retraction - 2-3 x daily - 7 x weekly - 2 sets - 10 reps - 5 second hold Seated Cervical Retraction and Extension - 2-3 x daily - 7 x weekly - 2 sets - 10 reps
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
## 2021-05-11 ENCOUNTER — Other Ambulatory Visit: Payer: Self-pay

## 2021-05-11 ENCOUNTER — Ambulatory Visit (HOSPITAL_COMMUNITY): Payer: Medicaid Other | Attending: Orthopaedic Surgery

## 2021-05-11 DIAGNOSIS — R293 Abnormal posture: Secondary | ICD-10-CM | POA: Diagnosis present

## 2021-05-11 DIAGNOSIS — M542 Cervicalgia: Secondary | ICD-10-CM | POA: Diagnosis present
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
## 2021-05-13 ENCOUNTER — Ambulatory Visit (HOSPITAL_COMMUNITY): Payer: Medicaid Other

## 2021-05-13 ENCOUNTER — Other Ambulatory Visit: Payer: Self-pay

## 2021-05-13 ENCOUNTER — Encounter (HOSPITAL_COMMUNITY): Payer: Self-pay

## 2021-05-13 DIAGNOSIS — R293 Abnormal posture: Secondary | ICD-10-CM

## 2021-05-13 DIAGNOSIS — M542 Cervicalgia: Secondary | ICD-10-CM | POA: Diagnosis not present

## 2021-05-13 NOTE — Patient Instructions (Signed)
Scapular Retraction: Abduction (Standing)    With arms elevated and elbows bent to 90, pinch shoulder blades together and press arms back. Repeat 10 times per set. Do 2 sets per sessions per day.  http://orth.exer.us/951   Copyright  VHI. All rights reserved.
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
## 2021-05-19 ENCOUNTER — Other Ambulatory Visit: Payer: Self-pay

## 2021-05-19 ENCOUNTER — Ambulatory Visit (HOSPITAL_COMMUNITY): Payer: Medicaid Other | Admitting: Physical Therapy

## 2021-05-19 DIAGNOSIS — R293 Abnormal posture: Secondary | ICD-10-CM

## 2021-05-19 DIAGNOSIS — M542 Cervicalgia: Secondary | ICD-10-CM
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
## 2021-05-21 ENCOUNTER — Ambulatory Visit (HOSPITAL_COMMUNITY): Payer: Medicaid Other | Admitting: Physical Therapy

## 2021-05-25 ENCOUNTER — Ambulatory Visit (HOSPITAL_COMMUNITY): Payer: Medicaid Other | Admitting: Physical Therapy

## 2021-05-25 ENCOUNTER — Ambulatory Visit: Payer: Medicaid Other | Admitting: Orthopaedic Surgery

## 2021-05-25 ENCOUNTER — Telehealth (HOSPITAL_COMMUNITY): Payer: Self-pay | Admitting: Physical Therapy

## 2021-05-25 NOTE — Telephone Encounter (Signed)
No Show. Called and patient reported she did not have this apt written down. Confirmed next apt with patient.  11:25 AM, 05/25/21 Tereasa Coop, DPT Physical Therapy with [facility]  [phone] office

## 2021-05-27 ENCOUNTER — Ambulatory Visit (HOSPITAL_COMMUNITY): Payer: Medicaid Other | Admitting: Physical Therapy

## 2021-05-27 ENCOUNTER — Other Ambulatory Visit: Payer: Self-pay

## 2021-05-27 DIAGNOSIS — M542 Cervicalgia: Secondary | ICD-10-CM

## 2021-05-27 DIAGNOSIS — R293 Abnormal posture: Secondary | ICD-10-CM

## 2021-05-27 NOTE — Patient Instructions (Signed)
Access Code: [email or web address] URL: [email or web address] Date: 05/27/2021 Prepared by: Georges Lynch  Exercises Standing Shoulder Row with Anchored Resistance - 1-2 x daily - 7 x weekly - 2 sets - 10 reps Shoulder Extension with Resistance - 1-2 x daily - 7 x weekly - 2 sets - 10 reps Standing Shoulder Horizontal Abduction with Resistance - 1-2 x daily - 7 x weekly - 2 sets - 10 reps
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
## 2021-06-01 ENCOUNTER — Other Ambulatory Visit: Payer: Self-pay

## 2021-06-01 ENCOUNTER — Ambulatory Visit (HOSPITAL_COMMUNITY): Payer: Medicaid Other | Admitting: Physical Therapy

## 2021-06-01 ENCOUNTER — Encounter (HOSPITAL_COMMUNITY): Payer: Self-pay | Admitting: Physical Therapy

## 2021-06-01 DIAGNOSIS — M542 Cervicalgia: Secondary | ICD-10-CM | POA: Diagnosis not present

## 2021-06-01 DIAGNOSIS — R293 Abnormal posture: Secondary | ICD-10-CM
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
## 2021-06-03 ENCOUNTER — Encounter (HOSPITAL_COMMUNITY): Payer: Medicaid Other | Admitting: Physical Therapy

## 2021-06-08 ENCOUNTER — Ambulatory Visit: Payer: Medicaid Other | Admitting: Orthopaedic Surgery

## 2021-06-10 ENCOUNTER — Ambulatory Visit: Payer: Medicaid Other | Admitting: Adult Health

## 2021-11-18 ENCOUNTER — Other Ambulatory Visit: Payer: Self-pay | Admitting: Adult Health

## 2022-06-21 ENCOUNTER — Encounter: Payer: Self-pay | Admitting: Adult Health

## 2022-06-21 ENCOUNTER — Ambulatory Visit: Payer: Medicaid Other | Admitting: Adult Health

## 2022-06-21 VITALS — BP 103/66 | HR 84 | Ht 59.0 in | Wt 120.5 lb

## 2022-06-21 DIAGNOSIS — N644 Mastodynia: Secondary | ICD-10-CM | POA: Diagnosis not present

## 2022-06-21 DIAGNOSIS — N6313 Unspecified lump in the right breast, lower outer quadrant: Secondary | ICD-10-CM

## 2022-06-21 DIAGNOSIS — N61 Mastitis without abscess: Secondary | ICD-10-CM

## 2022-06-21 MED ORDER — DICLOXACILLIN SODIUM 500 MG PO CAPS: 500.0000 mg | ORAL_CAPSULE | Freq: Four times a day (QID) | ORAL | 0 refills | Status: DC

## 2022-06-21 NOTE — Progress Notes (Signed)
  Subjective:     Patient ID: Casey Maxwell, female   DOB: May 02, 1996, 26 y.o.   MRN: [id number]  HPI Casey Maxwell is a 26 year old white female, married, [id number], in complaining of soreness in right breast for 1 day and knot with redness since last week. She is not breastfeeding.  Last pap was 07/14/20, ASCUS with negative HPV.  Review of Systems +soreness right breast +knot right breast  Reviewed past medical,surgical, social and family history. Reviewed medications and allergies.     Objective:   Physical Exam BP 103/66 (BP Location: Left Arm, Patient Position: Sitting, Cuff Size: Normal)   Pulse 84   Ht 4\' 11"  (1.499 m)   Wt 120 lb 8 oz (54.7 kg)   LMP 05/12/2022 (Approximate)   BMI 24.34 kg/m      Skin warm and dry,  Breasts:no dominate palpable mass, retraction or nipple discharge on the left, on the right, no retraction or nipple discharge, has redness and 1-2 mass at 7-8 0' clock, and is warm and tender.    Upstream - 06/21/22 1341       Pregnancy Intention Screening   Does the patient want to become pregnant in the next year? No    Does the patient's partner want to become pregnant in the next year? No    Would the patient like to discuss contraceptive options today? No      Contraception Wrap Up   Current Method Hormonal Implant    End Method Hormonal Implant             Assessment:     1. Mass of lower outer quadrant of right breast Will recheck in 9 days if persists will get 08/21/22   2. Breast tenderness Can use ice Take tylenol and/or Advil for pain if needed   3. Mastitis, right, acute Will rx Dicloxacillin  Meds ordered this encounter  Medications   dicloxacillin (DYNAPEN) 500 MG capsule    Sig: Take 1 capsule (500 mg total) by mouth 4 (four) times daily.    Dispense:  28 capsule    Refill:  0    Order Specific Question:   Supervising Provider    Answer:   [place] H [[id number]]   Review handout on mastitis Use condoms while on  dicloxacillin     Plan:     Follow up in 9 days for recheck if persists will get Duane Lope right breast

## 2022-06-21 NOTE — Patient Instructions (Signed)
Use condoms if has sex.

## 2022-06-30 ENCOUNTER — Ambulatory Visit: Payer: Medicaid Other | Admitting: Adult Health

## 2022-07-01 ENCOUNTER — Ambulatory Visit: Payer: Medicaid Other | Admitting: Adult Health

## 2022-07-01 ENCOUNTER — Encounter: Payer: Self-pay | Admitting: Adult Health

## 2022-07-01 VITALS — BP 111/75 | HR 93 | Ht 59.0 in | Wt 118.0 lb

## 2022-07-01 DIAGNOSIS — N61 Mastitis without abscess: Secondary | ICD-10-CM | POA: Diagnosis not present

## 2022-07-01 DIAGNOSIS — N644 Mastodynia: Secondary | ICD-10-CM | POA: Diagnosis not present

## 2022-07-01 DIAGNOSIS — N6313 Unspecified lump in the right breast, lower outer quadrant: Secondary | ICD-10-CM | POA: Diagnosis not present

## 2022-07-01 NOTE — Progress Notes (Signed)
  Subjective:     Patient ID: Casey Maxwell, female   DOB: [birth year], 26 y.o.   MRN: [id number]  HPI Casey Maxwell is a 26 year old white female, married, G3P2012 back in follow up after being treated for mastitis 06/21/22 with Dicloxacillin and is better, nipple tender, now.   Review of Systems Right breast feels better,redness resolved, nipple tender  Reviewed past medical,surgical, social and family history. Reviewed medications and allergies.     Objective:   Physical Exam BP 111/75 (BP Location: Right Arm, Patient Position: Sitting, Cuff Size: Normal)   Pulse 93   Ht 4\' 11"  (1.499 m)   Wt 118 lb (53.5 kg)   LMP 06/27/2022 (Approximate)   BMI 23.83 kg/m      Skin warm and dry,  Breasts:no dominate palpable mass, retraction or nipple discharge   Upstream - 07/01/22 1129       Pregnancy Intention Screening   Does the patient's partner want to become pregnant in the next year? No    Would the patient like to discuss contraceptive options today? No      Contraception Wrap Up   Current Method Hormonal Implant    End Method Hormonal Implant    Contraception Counseling Provided No             Assessment:     1. Mass of lower outer quadrant of right breast Has 1 cm irregular mass at 7-8 0'clock, no redness  - US BREAST LTD UNI RIGHT INC AXILLA; Future  2. Breast tenderness Right nipple tender now - US BREAST LTD UNI RIGHT INC AXILLA; Future  3. Mastitis, right, acute Resolved, no redness or tenderness over mass    Plan:     Has pap and physical 08/03/22

## 2022-07-26 ENCOUNTER — Ambulatory Visit (HOSPITAL_COMMUNITY): Payer: Medicaid Other

## 2022-07-28 ENCOUNTER — Ambulatory Visit (HOSPITAL_COMMUNITY): Admission: RE | Admit: 2022-07-28 | Payer: Medicaid Other | Source: Ambulatory Visit

## 2022-08-03 ENCOUNTER — Ambulatory Visit: Payer: Medicaid Other | Admitting: Adult Health

## 2022-11-04 ENCOUNTER — Other Ambulatory Visit: Payer: Self-pay | Admitting: Adult Health

## 2022-11-04 MED ORDER — MEGESTROL ACETATE 40 MG PO TABS: ORAL_TABLET | ORAL | 1 refills | Status: AC

## 2022-11-04 NOTE — Telephone Encounter (Signed)
Refilled megace. Pt aware

## 2022-11-04 NOTE — Telephone Encounter (Signed)
From: Waynette Buttery Cude Finnan To: Office of Derrek Mon[place], [place] Sent: 11/03/2022 10:13 PM EST Subject: Medication Renewal Request  Refills have been requested for the following medications:   megestrol (MEGACE) 40 MG tablet [Casey Maxwell]  Preferred pharmacy: [place], [place] Delivery method: [employer]

## 2022-12-14 ENCOUNTER — Other Ambulatory Visit (HOSPITAL_COMMUNITY)
Admission: RE | Admit: 2022-12-14 | Discharge: 2022-12-14 | Disposition: A | Discharge status: Home / Self Care | Payer: 59 | Source: Ambulatory Visit | Attending: Adult Health | Admitting: Adult Health

## 2022-12-14 ENCOUNTER — Ambulatory Visit: Payer: 59 | Admitting: Adult Health

## 2022-12-14 ENCOUNTER — Encounter: Payer: Self-pay | Admitting: Adult Health

## 2022-12-14 VITALS — BP 113/82 | HR 84 | Ht 59.0 in | Wt 123.0 lb

## 2022-12-14 DIAGNOSIS — Z0000 Encounter for general adult medical examination without abnormal findings: Secondary | ICD-10-CM | POA: Insufficient documentation

## 2022-12-14 DIAGNOSIS — Z01419 Encounter for gynecological examination (general) (routine) without abnormal findings: Secondary | ICD-10-CM | POA: Insufficient documentation

## 2022-12-14 DIAGNOSIS — Z8742 Personal history of other diseases of the female genital tract: Secondary | ICD-10-CM | POA: Diagnosis not present

## 2022-12-14 DIAGNOSIS — Z975 Presence of (intrauterine) contraceptive device: Secondary | ICD-10-CM | POA: Diagnosis not present

## 2022-12-14 HISTORY — DX: Personal history of other diseases of the female genital tract: Z87.42

## 2022-12-14 NOTE — Progress Notes (Signed)
Patient ID: Casey Maxwell, female   DOB: [birth year], 27 y.o.   MRN: [id number] History of Present Illness: Casey Maxwell is a 27 year old white female, married, [id number] in for a well woman gyn exam and pap.    Current Medications, Allergies, Past Medical History, Past Surgical History, Family History and Social History were reviewed in [employer] record.     Review of Systems: Patient denies any headaches, hearing loss, fatigue, blurred vision, shortness of breath, chest pain, abdominal pain, problems with bowel movements, urination, or intercourse. No joint pain or mood swings.     Physical Exam:BP 113/82 (BP Location: Left Arm, Patient Position: Sitting, Cuff Size: Normal)   Pulse 84   Ht '4\' 11"'$  (1.499 m)   Wt 123 lb (55.8 kg)   LMP 11/30/2022   BMI 24.84 kg/m   General:  Well developed, well nourished, no acute distress Skin:  Warm and dry Neck:  Midline trachea, normal thyroid, good ROM, no lymphadenopathy Lungs; Clear to auscultation bilaterally Breast:  No dominant palpable mass, retraction, or nipple discharge Cardiovascular: Regular rate and rhythm Abdomen:  Soft, non tender, no hepatosplenomegaly Pelvic:  External genitalia is normal in appearance, no lesions.  The vagina is normal in appearance. Urethra has no lesions or masses. The cervix is bulbous. Pap with HR HPV genotyping performed. Uterus is felt to be normal size, shape, and contour.  No adnexal masses or tenderness noted.Bladder is non tender, no masses felt. Extremities/musculoskeletal:  No swelling or varicosities noted, no clubbing or cyanosis Psych:  No mood changes, alert and cooperative,seems happy AA is 2 Fall risk is low    12/14/2022   10:44 AM 04/15/2021   10:08 AM  Depression screen PHQ 2/9  Decreased Interest 0 0  Down, Depressed, Hopeless 1 0  PHQ - 2 Score 1 0  Altered sleeping 0 0  Tired, decreased energy 1 0  Change in appetite 0 0  Feeling bad or failure about  yourself  0 0  Trouble concentrating 0 0  Moving slowly or fidgety/restless 0 0  Suicidal thoughts 0 0  PHQ-9 Score 2 0       12/14/2022   10:45 AM 04/15/2021   10:08 AM  GAD 7 : Generalized Anxiety Score  Nervous, Anxious, on Edge 0 0  Control/stop worrying 0 0  Worry too much - different things 1 0  Trouble relaxing 0 0  Restless 0 0  Easily annoyed or irritable 1 0  Afraid - awful might happen 0 0  Total GAD 7 Score 2 0      Upstream - 12/14/22 1043       Pregnancy Intention Screening   Does the patient want to become pregnant in the next year? No    Does the patient's partner want to become pregnant in the next year? No    Would the patient like to discuss contraceptive options today? No      Contraception Wrap Up   Current Method Hormonal Implant;Female Condom    End Method Hormonal Implant;Female Condom            Examination chaperoned by Alvera Singh NP student Co exam with Alvera Singh NP student   Impression and Plan: 1. Encounter for gynecological examination with Papanicolaou smear of cervix Pap sent Pap in 3 years if normal Physical in 1 year   2. Routine general medical examination at a health care facility Pap sent   3. Nexplanon in place It expired  in January 2024 Use condoms  Return 12/21/22 for nexplanon removal and reinsertion with me  4. History of abnormal cervical Papanicolaou smear Pap sent

## 2022-12-19 LAB — CYTOLOGY - PAP
Comment: NEGATIVE
Diagnosis: NEGATIVE
High risk HPV: NEGATIVE

## 2022-12-21 ENCOUNTER — Ambulatory Visit (INDEPENDENT_AMBULATORY_CARE_PROVIDER_SITE_OTHER): Payer: 59 | Admitting: Adult Health

## 2022-12-21 ENCOUNTER — Encounter: Payer: Self-pay | Admitting: Adult Health

## 2022-12-21 VITALS — BP 120/78 | HR 116 | Ht 59.0 in | Wt 123.0 lb

## 2022-12-21 DIAGNOSIS — Z3046 Encounter for surveillance of implantable subdermal contraceptive: Secondary | ICD-10-CM | POA: Insufficient documentation

## 2022-12-21 DIAGNOSIS — Z3202 Encounter for pregnancy test, result negative: Secondary | ICD-10-CM | POA: Insufficient documentation

## 2022-12-21 LAB — POCT URINE PREGNANCY: Preg Test, Ur: NEGATIVE

## 2022-12-21 MED ORDER — ETONOGESTREL 68 MG ~~LOC~~ IMPL
68.0000 mg | DRUG_IMPLANT | Freq: Once | SUBCUTANEOUS | Status: AC
  Administered 2022-12-21: 68 mg via SUBCUTANEOUS

## 2022-12-21 NOTE — Progress Notes (Signed)
  Subjective:     Patient ID: Casey Maxwell, female   DOB: [birth year], 27 y.o.   MRN: [id number]  HPI Casey Maxwell is a 27 year old white female,married, [id number], in for nexplanon removal and reinsertion.  Last pap was negative HPV, NILM 12/14/22   Review of Systems For nexplanon removal and reinsertion Reviewed past medical,surgical, social and family history. Reviewed medications and allergies.     Objective:   Physical Exam BP 120/78 (BP Location: Right Arm, Patient Position: Sitting, Cuff Size: Normal)   Pulse (!) 116   Ht 4\' 11"  (1.499 m)   Wt 123 lb (55.8 kg)   LMP 11/30/2022   BMI 24.84 kg/m  UPT is negative Consent signed and time out called. Left arm cleansed with betadine, and injected with 1.5 cc 2% lidocaine and waited til numb.Under sterile technique a #11 blade was used to make small vertical incision, and a curved forceps was used to easily remove rod.New rod placed and palpated by provider and pt. Steri strips applied. Pressure dressing applied.      Upstream - 12/21/22 1603       Pregnancy Intention Screening   Does the patient want to become pregnant in the next year? No    Does the patient's partner want to become pregnant in the next year? No    Would the patient like to discuss contraceptive options today? No      Contraception Wrap Up   Current Method Hormonal Implant    End Method Hormonal Implant             Assessment:      1. Pregnancy examination or test, negative result  - POCT urine pregnancy  2. Encounter for removal and reinsertion of Nexplanon Lot [id number] 2025-11 - etonogestrel (NEXPLANON) implant 68 mg    Use condoms x 2 weeks, keep clean and dry x 24 hours, no heavy lifting, keep steri strips on x 72 hours, Keep pressure dressing on x 24 hours. Follow up prn problems.   Plan:     Follow up prn

## 2022-12-21 NOTE — Patient Instructions (Signed)
Use condoms x 2 weeks, keep clean and dry x 24 hours, no heavy lifting, keep steri strips on x 72 hours, Keep pressure dressing on x 24 hours. Follow up prn problems.  

## 2023-06-20 ENCOUNTER — Ambulatory Visit: Payer: Medicaid Other | Admitting: Adult Health

## 2024-04-29 ENCOUNTER — Ambulatory Visit
Admission: EM | Admit: 2024-04-29 | Discharge: 2024-04-29 | Disposition: A | Discharge status: Home / Self Care | Attending: Family Medicine | Admitting: Family Medicine

## 2024-04-29 DIAGNOSIS — J029 Acute pharyngitis, unspecified: Secondary | ICD-10-CM | POA: Diagnosis not present

## 2024-04-29 DIAGNOSIS — R0982 Postnasal drip: Secondary | ICD-10-CM | POA: Diagnosis not present

## 2024-04-29 LAB — POCT RAPID STREP A (OFFICE): Rapid Strep A Screen: NEGATIVE

## 2024-04-29 MED ORDER — PSEUDOEPHEDRINE HCL 60 MG PO TABS
60.0000 mg | ORAL_TABLET | Freq: Three times a day (TID) | ORAL | 0 refills | Status: AC | PRN
Start: 2024-04-29 — End: ?

## 2024-04-29 MED ORDER — AZELASTINE HCL 0.1 % NA SOLN: 1.0000 | Freq: Two times a day (BID) | NASAL | 0 refills | Status: AC
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
## 2024-04-29 NOTE — ED Triage Notes (Signed)
 Pt reports sore throat since may, pain and discomfort

## 2024-04-29 NOTE — Discharge Instructions (Signed)
 Your vital signs and exam are very reassuring, your strep test was negative.  I suspect sinus drainage causing your ongoing sore throat.  In addition to an allergy medication, I have prescribed a nasal spray to use consistently and decongestant as needed.

## 2024-04-30 ENCOUNTER — Ambulatory Visit
